# Patient Record
Sex: Female | Born: 1960 | Race: White | Hispanic: No | Marital: Married | Smoking: Never smoker
Health system: Southern US, Community
[De-identification: ages and names within clinical notes are randomized; demographics above are authoritative.]

## PROBLEM LIST (undated history)

## (undated) DIAGNOSIS — M1611 Unilateral primary osteoarthritis, right hip: Secondary | ICD-10-CM

## (undated) DIAGNOSIS — G709 Myoneural disorder, unspecified: Secondary | ICD-10-CM

## (undated) DIAGNOSIS — D649 Anemia, unspecified: Secondary | ICD-10-CM

## (undated) DIAGNOSIS — F988 Other specified behavioral and emotional disorders with onset usually occurring in childhood and adolescence: Secondary | ICD-10-CM

## (undated) DIAGNOSIS — C801 Malignant (primary) neoplasm, unspecified: Secondary | ICD-10-CM

## (undated) DIAGNOSIS — I959 Hypotension, unspecified: Secondary | ICD-10-CM

## (undated) DIAGNOSIS — Z9889 Other specified postprocedural states: Secondary | ICD-10-CM

## (undated) DIAGNOSIS — R112 Nausea with vomiting, unspecified: Secondary | ICD-10-CM

## (undated) DIAGNOSIS — M51369 Other intervertebral disc degeneration, lumbar region without mention of lumbar back pain or lower extremity pain: Secondary | ICD-10-CM

## (undated) DIAGNOSIS — E039 Hypothyroidism, unspecified: Secondary | ICD-10-CM

## (undated) DIAGNOSIS — E079 Disorder of thyroid, unspecified: Secondary | ICD-10-CM

## (undated) DIAGNOSIS — I82890 Acute embolism and thrombosis of other specified veins: Secondary | ICD-10-CM

## (undated) DIAGNOSIS — L570 Actinic keratosis: Secondary | ICD-10-CM

## (undated) HISTORY — PX: ARTHROSCOPIC REPAIR ACL: SUR80

## (undated) HISTORY — PX: EYE SURGERY: SHX253

## (undated) HISTORY — PX: OTHER SURGICAL HISTORY: SHX169

## (undated) HISTORY — PX: ABDOMINAL HYSTERECTOMY: SHX81

## (undated) HISTORY — PX: WISDOM TOOTH EXTRACTION: SHX21

---

## 2014-05-08 ENCOUNTER — Emergency Department (HOSPITAL_BASED_OUTPATIENT_CLINIC_OR_DEPARTMENT_OTHER): Payer: BC Managed Care – PPO

## 2014-05-08 ENCOUNTER — Encounter (HOSPITAL_BASED_OUTPATIENT_CLINIC_OR_DEPARTMENT_OTHER): Payer: Self-pay | Admitting: *Deleted

## 2014-05-08 ENCOUNTER — Emergency Department (HOSPITAL_BASED_OUTPATIENT_CLINIC_OR_DEPARTMENT_OTHER)
Admission: EM | Admit: 2014-05-08 | Discharge: 2014-05-08 | Disposition: A | Discharge status: Home / Self Care | Payer: BC Managed Care – PPO | Attending: Emergency Medicine | Admitting: Emergency Medicine

## 2014-05-08 DIAGNOSIS — W11XXXA Fall on and from ladder, initial encounter: Secondary | ICD-10-CM | POA: Diagnosis not present

## 2014-05-08 DIAGNOSIS — S92001A Unspecified fracture of right calcaneus, initial encounter for closed fracture: Secondary | ICD-10-CM | POA: Diagnosis not present

## 2014-05-08 DIAGNOSIS — Y998 Other external cause status: Secondary | ICD-10-CM | POA: Insufficient documentation

## 2014-05-08 DIAGNOSIS — Y9389 Activity, other specified: Secondary | ICD-10-CM | POA: Insufficient documentation

## 2014-05-08 DIAGNOSIS — Z79899 Other long term (current) drug therapy: Secondary | ICD-10-CM | POA: Diagnosis not present

## 2014-05-08 DIAGNOSIS — S43014A Anterior dislocation of right humerus, initial encounter: Secondary | ICD-10-CM | POA: Diagnosis not present

## 2014-05-08 DIAGNOSIS — E079 Disorder of thyroid, unspecified: Secondary | ICD-10-CM | POA: Diagnosis not present

## 2014-05-08 DIAGNOSIS — M24412 Recurrent dislocation, left shoulder: Secondary | ICD-10-CM

## 2014-05-08 DIAGNOSIS — S4991XA Unspecified injury of right shoulder and upper arm, initial encounter: Secondary | ICD-10-CM | POA: Diagnosis present

## 2014-05-08 DIAGNOSIS — W19XXXA Unspecified fall, initial encounter: Secondary | ICD-10-CM

## 2014-05-08 DIAGNOSIS — Y9289 Other specified places as the place of occurrence of the external cause: Secondary | ICD-10-CM | POA: Insufficient documentation

## 2014-05-08 HISTORY — DX: Disorder of thyroid, unspecified: E07.9

## 2014-05-08 MED ORDER — OXYCODONE-ACETAMINOPHEN 5-325 MG PO TABS: 1.0000 | ORAL_TABLET | Freq: Four times a day (QID) | ORAL | Status: DC | PRN

## 2014-05-08 MED ORDER — ONDANSETRON HCL 4 MG/2ML IJ SOLN
4.0000 mg | Freq: Once | INTRAMUSCULAR | Status: AC
  Administered 2014-05-08: 4 mg via INTRAVENOUS

## 2014-05-08 MED ORDER — MORPHINE SULFATE 4 MG/ML IJ SOLN
4.0000 mg | Freq: Once | INTRAMUSCULAR | Status: DC
  Filled 2014-05-08: qty 1

## 2014-05-08 MED ORDER — ONDANSETRON HCL 4 MG PO TABS: 4.0000 mg | ORAL_TABLET | Freq: Four times a day (QID) | ORAL | Status: DC

## 2014-05-08 MED ORDER — MORPHINE SULFATE 4 MG/ML IJ SOLN
4.0000 mg | Freq: Once | INTRAMUSCULAR | Status: AC
  Administered 2014-05-08: 4 mg via INTRAVENOUS
  Filled 2014-05-08: qty 1

## 2014-05-08 MED ORDER — MORPHINE SULFATE 4 MG/ML IJ SOLN
4.0000 mg | Freq: Once | INTRAMUSCULAR | Status: AC
  Administered 2014-05-08: 4 mg via INTRAVENOUS

## 2014-05-08 MED ORDER — ONDANSETRON HCL 4 MG/2ML IJ SOLN
4.0000 mg | Freq: Once | INTRAMUSCULAR | Status: DC
  Filled 2014-05-08: qty 2

## 2014-05-08 MED ORDER — FENTANYL CITRATE 0.05 MG/ML IJ SOLN
50.0000 ug | Freq: Once | INTRAMUSCULAR | Status: AC
  Administered 2014-05-08: 50 ug via INTRAVENOUS
  Filled 2014-05-08: qty 2

## 2014-05-08 NOTE — ED Notes (Addendum)
Pt c/o fall from ladder 6 ft landing on hardwood floor injuring left ankle and shoulder  X 1 hr ago denies head injury

## 2014-05-08 NOTE — Discharge Instructions (Signed)
Calcaneal Fracture Repair  There are many different ways of treating fractures of the large irregular bone in the foot that makes up the heel of the foot (calcaneus). Calcaneal fractures can be treated with:    Immobilization--The fracture is casted as it is without changing the positions of the fracture involved.    Closed reduction--The bones are manipulated back into position without opening the site of the fracture using surgery.    Open reduction and internal fixation--The fracture site is opened and the bone pieces are fixed into place with some type of hardware (such as a screw).    Primary arthrodesis--The joint has enough damage that a procedure is done as the first treatment which will leave the joint permanently stiff. This will decrease function, however usually will leave the joint pain free.  LET YOUR HEALTH CARE PROVIDER KNOW ABOUT:   Any allergies you have.    All medicines you are taking, including vitamins, herbs, eye drops, creams, and over-the-counter medicines.    Previous problems you or members of your family have had with the use of anesthetics.   Any blood disorders you have.    Previous surgeries you have had.    Medical conditions you have.   RISKS AND COMPLICATIONS  Generally, calcaneal fracture repair is a safe procedure. However, as with any procedure, complications can occur. Possible complications include:    Swelling of the foot and ankle.   Infection of the wound or bone.   Arthritis.   Chronic pain of the foot.   Nerve injury.   Blood clot in the legs or lungs.  BEFORE THE PROCEDURE   Ask your health care provider about changing or stopping your regular medicines. You may need to stop taking certain medicines, such as aspirin or blood thinners, at least 1 week before the surgery.   X-rays and any imaging studies are reviewed with your healthcare provider. The surgeon will advise you on the best surgical approach to repair your fracture.   Do not eat or  drink anything for at least 8 hours before the surgery or as directed by your health care provider.    If you smoke, do not smoke for at least 2 weeks before the surgery.    Make plans to have someone drive you home after the procedure. Also arrange for someone to help you with activities during recovery.   PROCEDURE    You will be given medicine to help you relax (sedative). You will then be given medicine to make you sleep through the procedure (general anesthetic). These medicines will be given through an IV access tube that is put into one of your veins.    A nerve block or numbing medicine (local anesthetic) may also be used to keep you comfortable.   Once you are asleep, the foot will be cleaned and shaved if needed.   The surgeon may use a percutaneous or open technique for this surgery:    In the percutaneous approach, small cuts and pins are used to repair the fracture.    In the open technique, a cut is made along the outside of the foot and the bone pieces are placed back together with hardware. A drain may be left to collect fluid. It is removed 3-4 days after the procedure.   The surgeon then uses staples or stitches to close the incision or cuts.  AFTER THE PROCEDURE   After surgery you will be taken to the recovery area where a nurse will   pain medicine if needed.  The IV access tube will be removed before you are discharged. Document Released: 02/18/2005 Document Revised: 03/01/2013 Document Reviewed: 12/13/2012 [facility] Patient Information 2015 [place], [place]. This information is not intended to replace advice given to you by your health care provider. Make sure you discuss any questions you have with your health care provider.   Shoulder Dislocation Your shoulder is made up of three  bones: the collar bone (clavicle); the shoulder blade (scapula), which includes the socket (glenoid cavity); and the upper arm bone (humerus). Your shoulder joint is the place where these bones meet. Strong, fibrous tissues hold these bones together (ligaments). Muscles and strong, fibrous tissues that connect the muscles to these bones (tendons) allow your arm to move through this joint. The range of motion of your shoulder joint is more extensive than most of your other joints, and the glenoid cavity is very shallow. That is the reason that your shoulder joint is one of the most unstable joints in your body. It is far more prone to dislocation than your other joints. Shoulder dislocation is when your humerus is forced out of your shoulder joint. CAUSES Shoulder dislocation is caused by a forceful impact on your shoulder. This impact usually is from an injury, such as a sports injury or a fall. SYMPTOMS Symptoms of shoulder dislocation include:  Deformity of your shoulder.  Intense pain.  Inability to move your shoulder joint.  Numbness, weakness, or tingling around your shoulder joint (your neck or down your arm).  Bruising or swelling around your shoulder. DIAGNOSIS In order to diagnose a dislocated shoulder, your caregiver will perform a physical exam. Your caregiver also may have an X-ray exam done to see if you have any broken bones. Magnetic resonance imaging (MRI) is a procedure that sometimes is done to help your caregiver see any damage to the soft tissues around your shoulder, particularly your rotator cuff tendons. Additionally, your caregiver also may have electromyography done to measure the electrical discharges produced in your muscles if you have signs or symptoms of nerve damage. TREATMENT A shoulder dislocation is treated by placing the humerus back in the joint (reduction). Your caregiver does this either manually (closed reduction), by moving your humerus back into the joint  through manipulation, or through surgery (open reduction). When your humerus is back in place, severe pain should improve almost immediately. You also may need to have surgery if you have a weak shoulder joint or ligaments, and you have recurring shoulder dislocations, despite rehabilitation. In rare cases, surgery is necessary if your nerves or blood vessels are damaged during the dislocation. After your reduction, your arm will be placed in a shoulder immobilizer or sling to keep it from moving. Your caregiver will have you wear your shoulder immobilizer or sling for 3 days to 3 weeks, depending on how serious your dislocation is. When your shoulder immobilizer or sling is removed, your caregiver may prescribe physical therapy to help improve the range of motion in your shoulder joint. HOME CARE INSTRUCTIONS  The following measures can help to reduce pain and speed up the healing process:  Rest your injured joint. Do not move it. Avoid activities similar to the one that caused your injury.  Apply ice to your injured joint for the first day or two after your reduction or as directed by your caregiver. Applying ice helps to reduce inflammation and pain.  Put ice in a plastic bag.  Place a towel between your skin and the  bag.  Leave the ice on for 15-20 minutes at a time, every 2 hours while you are awake.  Exercise your hand by squeezing a soft ball. This helps to eliminate stiffness and swelling in your hand and wrist.  Take over-the-counter or prescription medicine for pain or discomfort as told by your caregiver. SEEK IMMEDIATE MEDICAL CARE IF:   Your shoulder immobilizer or sling becomes damaged.  Your pain becomes worse rather than better.  You lose feeling in your arm or hand, or they become white and cold. MAKE SURE YOU:   Understand these instructions.  Will watch your condition.  Will get help right away if you are not doing well or get worse. Document Released: 02/03/2001  Document Revised: 09/25/2013 Document Reviewed: 03/01/2011 [facility] Patient Information 2015 [place], [place]. This information is not intended to replace advice given to you by your health care provider. Make sure you discuss any questions you have with your health care provider.

## 2014-05-08 NOTE — ED Provider Notes (Signed)
CSN: [id number]     Arrival date & time 05/08/14  1322 History   First MD Initiated Contact with Patient 05/08/14 1457     Chief Complaint  Patient presents with  . Fall     (Consider location/radiation/quality/duration/timing/severity/associated sxs/prior Treatment) HPI Comments: Patient fell approximately 6 feet off a ladder onto her right heel and onto her right shoulder.  She had her chin on the ladder rung on the way down.  She did not hit her head.  She denies headache, neck pain, shortness of breath, numbness or weakness.  She has no back pain.  Patient is a 53 y.o. female presenting with fall. The history is provided by the patient. No language interpreter was used.  Fall This is a new problem. The current episode started 3 to 5 hours ago. The problem occurs rarely. The problem has not changed since onset.Pertinent negatives include no chest pain, no abdominal pain, no headaches and no shortness of breath. Associated symptoms comments: R heel and R shoulder pain . Exacerbated by: Palpation and movement. Relieved by: Immobilization. She has tried rest for the symptoms. The treatment provided mild relief.    Past Medical History  Diagnosis Date  . Thyroid disease    Past Surgical History  Procedure Laterality Date  . Abdominal hysterectomy    . Arthroscopic repair acl     History reviewed. No pertinent family history. History  Substance Use Topics  . Smoking status: Never Smoker   . Smokeless tobacco: Not on file  . Alcohol Use: No   OB History    No data available     Review of Systems  Constitutional: Negative for fever, chills, diaphoresis, activity change, appetite change and fatigue.  HENT: Negative for congestion, facial swelling, rhinorrhea and sore throat.   Eyes: Negative for photophobia and discharge.  Respiratory: Negative for cough, chest tightness and shortness of breath.   Cardiovascular: Negative for chest pain, palpitations and leg swelling.    Gastrointestinal: Negative for nausea, vomiting, abdominal pain and diarrhea.  Endocrine: Negative for polydipsia and polyuria.  Genitourinary: Negative for dysuria, frequency, difficulty urinating and pelvic pain.  Musculoskeletal: Negative for back pain, arthralgias, neck pain and neck stiffness.  Skin: Negative for color change and wound.  Allergic/Immunologic: Negative for immunocompromised state.  Neurological: Negative for facial asymmetry, weakness, numbness and headaches.  Hematological: Does not bruise/bleed easily.  Psychiatric/Behavioral: Negative for confusion and agitation.      Allergies  Ciprofloxacin  Home Medications   Prior to Admission medications   Medication Sig Start Date End Date Taking? Authorizing Provider  levothyroxine (SYNTHROID, LEVOTHROID) 100 MCG tablet Take 100 mcg by mouth daily before breakfast.   Yes Historical Provider, MD  testosterone (ANDROGEL) 50 MG/5GM (1%) GEL Place 5 g onto the skin daily.   Yes Historical Provider, MD  ondansetron (ZOFRAN) 4 MG tablet Take 1 tablet (4 mg total) by mouth every 6 (six) hours. 05/08/14   Toy Cookey, MD  oxyCODONE-acetaminophen (PERCOCET) 5-325 MG per tablet Take 1-2 tablets by mouth every 6 (six) hours as needed. 05/08/14   Toy Cookey, MD   BP 101/48 mmHg  Pulse 64  Temp(Src) 98.9 F (37.2 C)  Resp 16  Ht 5\' 6"  (1.676 m)  Wt 140 lb (63.504 kg)  BMI 22.61 kg/m2  SpO2 100% Physical Exam  Constitutional: She is oriented to person, place, and time. She appears well-developed and well-nourished. No distress.  HENT:  Head: Normocephalic and atraumatic.  Mouth/Throat: No oropharyngeal exudate.  Eyes:  Pupils are equal, round, and reactive to light.  Neck: Normal range of motion. Neck supple.  Cardiovascular: Normal rate, regular rhythm and normal heart sounds.  Exam reveals no gallop and no friction rub.   No murmur heard. Pulmonary/Chest: Effort normal and breath sounds normal. No respiratory  distress. She has no wheezes. She has no rales.  Abdominal: Soft. Bowel sounds are normal. She exhibits no distension and no mass. There is no tenderness. There is no rebound and no guarding.  Musculoskeletal: Normal range of motion. She exhibits no edema or tenderness.       Right shoulder: She exhibits deformity (deformity consistent with anterior shoulder dislocation).       Feet:  Neurological: She is alert and oriented to person, place, and time.  Skin: Skin is warm and dry.  Psychiatric: She has a normal mood and affect.    ED Course  Reduction of dislocation Date/Time: 05/08/2014 4:08 PM Performed by: Toy CookeyHERTY, MEGAN Authorized by: Toy CookeyHERTY, MEGAN Consent: Verbal consent obtained. Written consent obtained. Risks and benefits: risks, benefits and alternatives were discussed Consent given by: patient Patient understanding: patient states understanding of the procedure being performed Patient consent: the patient's understanding of the procedure matches consent given Procedure consent: procedure consent matches procedure scheduled Relevant documents: relevant documents present and verified Test results: test results available and properly labeled Site marked: the operative site was marked Imaging studies: imaging studies available Required items: required blood products, implants, devices, and special equipment available Patient identity confirmed: verbally with patient Time out: Immediately prior to procedure a "time out" was called to verify the correct patient, procedure, equipment, support staff and site/side marked as required. Local anesthesia used: no Patient sedated: Morphine given for pain. Patient tolerance: Patient tolerated the procedure well with no immediate complications Comments: Shoulder reduced using massage and gentle manipulation   (including critical care time) Labs Review Labs Reviewed - No data to display  Imaging Review Dg Shoulder  Right  05/08/2014   CLINICAL DATA:  Status post reduction of anterior right shoulder dislocation.  EXAM: RIGHT SHOULDER - 2+ VIEW  COMPARISON:  Plain films right shoulder 05/08/2014 at 14:10 hours  FINDINGS: The humeral head appears reduced.  No new abnormality.  IMPRESSION: Successful reduction of shoulder dislocation.   Electronically Signed   By: Drusilla Kannerhomas  Dalessio M.D.   On: 05/08/2014 15:59   Dg Shoulder Right  05/08/2014   CLINICAL DATA:  Right shoulder pain, fell off ladder this morning  EXAM: RIGHT SHOULDER - 2+ VIEW  COMPARISON:  None.  FINDINGS: Two views of the right shoulder submitted. There is anterior subluxation of humeral head from glenohumeral joint. No acute fracture.  IMPRESSION: Anterior shoulder dislocation.   Electronically Signed   By: Natasha MeadLiviu  Pop M.D.   On: 05/08/2014 15:05   Dg Ankle Complete Right  05/08/2014   CLINICAL DATA:  Larey SeatFell off ladder this morning, right shoulder pain, right ankle pain  EXAM: RIGHT ANKLE - COMPLETE 3+ VIEW  COMPARISON:  None.  FINDINGS: Three views of the right ankle submitted. No ankle fracture or subluxation. Ankle mortise is preserved. There is minimal impacted probable comminuted fracture of the right calcaneus.  IMPRESSION: No ankle fracture. Minimal impacted probable comminuted fracture of the right calcaneus.   Electronically Signed   By: Natasha MeadLiviu  Pop M.D.   On: 05/08/2014 14:27     EKG Interpretation None      MDM   Final diagnoses:  Fall from ladder, initial encounter  Right calcaneal fracture,  closed, initial encounter  Anterior shoulder dislocation, right, initial encounter    Pt is a 53 y.o. female with Pmhx as above who presents with right heel and right shoulder pain after falling off a ladder from approximately 6 feet onto a hard wood floor at approximately 12:30 PM.  She states that she hit her chin on the way down, but did not hit her head.  She had no loss of consciousness.  She has no headache or neck pain, no back pain, no  abdominal or chest pain.  On physical exam vital signs are stable.  Patient is uncomfortable but in no acute distress.  She has deformity of right shoulder consistent with anterior dislocation.  She also has ecchymosis about the left ankle with bony tenderness over the calcaneus.  X-ray of shoulder confirms anterior dislocation without fracture, which was reduced at bedside using massage and gentle manupulation after IV morphine. Repeat XR confims relocation.    An x-ray of the ankle shows no ankle fracture per but minimally impacted probable comminuted fracture of her right calcaneus. NVI distally, no reproducible back pain.  I will speak with orthopedics regarding close outpatient follow-up.    I spoke w/ Dr. Despina HickAlusio, who will be out of town for a few weeks starting tomorrow and would lke her to follow up in office w/ Dr. Victorino DikeHewitt.   Misty StanleyStacey Moll evaluation in the Emergency Department is complete. It has been determined that no acute conditions requiring further emergency intervention are present at this time. The patient/guardian have been advised of the diagnosis and plan. We have discussed signs and symptoms that warrant return to the ED, such as changes or worsening in symptoms, worsening or uncontrolled pain, numbness, weakness.      Toy CookeyMegan Docherty, MD 05/08/14 [phone]

## 2014-05-09 NOTE — ED Notes (Signed)
Received phone call from husband; unable to get appt with [facility] as follow MD is out of the country and other MD in practice is not available.   [facility], Dr. Lajoyce Cornersuda [phone].

## 2014-05-10 ENCOUNTER — Other Ambulatory Visit: Payer: Self-pay | Admitting: Orthopedic Surgery

## 2014-05-10 ENCOUNTER — Ambulatory Visit
Admission: RE | Admit: 2014-05-10 | Discharge: 2014-05-10 | Disposition: A | Discharge status: Home / Self Care | Payer: BC Managed Care – PPO | Source: Ambulatory Visit | Attending: Orthopedic Surgery | Admitting: Orthopedic Surgery

## 2014-05-10 DIAGNOSIS — Z01818 Encounter for other preprocedural examination: Secondary | ICD-10-CM

## 2014-05-10 DIAGNOSIS — R52 Pain, unspecified: Secondary | ICD-10-CM

## 2014-05-11 ENCOUNTER — Ambulatory Visit (HOSPITAL_COMMUNITY): Payer: BC Managed Care – PPO | Admitting: Anesthesiology

## 2014-05-11 ENCOUNTER — Encounter (HOSPITAL_COMMUNITY)
Admission: AD | Disposition: A | Discharge status: Home / Self Care | Payer: Self-pay | Source: Ambulatory Visit | Attending: Orthopedic Surgery

## 2014-05-11 ENCOUNTER — Encounter (HOSPITAL_COMMUNITY): Payer: Self-pay | Admitting: *Deleted

## 2014-05-11 ENCOUNTER — Other Ambulatory Visit (HOSPITAL_COMMUNITY): Payer: Self-pay | Admitting: Orthopedic Surgery

## 2014-05-11 ENCOUNTER — Ambulatory Visit (HOSPITAL_COMMUNITY)
Admission: AD | Admit: 2014-05-11 | Discharge: 2014-05-13 | Disposition: A | Discharge status: Home / Self Care | Payer: BC Managed Care – PPO | Source: Ambulatory Visit | Attending: Orthopedic Surgery | Admitting: Orthopedic Surgery

## 2014-05-11 DIAGNOSIS — S92009A Unspecified fracture of unspecified calcaneus, initial encounter for closed fracture: Secondary | ICD-10-CM | POA: Diagnosis present

## 2014-05-11 DIAGNOSIS — Z881 Allergy status to other antibiotic agents status: Secondary | ICD-10-CM | POA: Diagnosis not present

## 2014-05-11 DIAGNOSIS — W11XXXA Fall on and from ladder, initial encounter: Secondary | ICD-10-CM | POA: Diagnosis not present

## 2014-05-11 DIAGNOSIS — E079 Disorder of thyroid, unspecified: Secondary | ICD-10-CM | POA: Insufficient documentation

## 2014-05-11 DIAGNOSIS — S92001A Unspecified fracture of right calcaneus, initial encounter for closed fracture: Secondary | ICD-10-CM

## 2014-05-11 DIAGNOSIS — Y9289 Other specified places as the place of occurrence of the external cause: Secondary | ICD-10-CM | POA: Diagnosis not present

## 2014-05-11 DIAGNOSIS — Y998 Other external cause status: Secondary | ICD-10-CM | POA: Diagnosis not present

## 2014-05-11 DIAGNOSIS — Y9389 Activity, other specified: Secondary | ICD-10-CM | POA: Insufficient documentation

## 2014-05-11 HISTORY — PX: ORIF CALCANEOUS FRACTURE: SHX5030

## 2014-05-11 HISTORY — DX: Hypotension, unspecified: I95.9

## 2014-05-11 LAB — CBC
HCT: 37.1 % (ref 36.0–46.0)
Hemoglobin: 12.2 g/dL (ref 12.0–15.0)
MCH: 29.1 pg (ref 26.0–34.0)
MCHC: 32.9 g/dL (ref 30.0–36.0)
MCV: 88.5 fL (ref 78.0–100.0)
PLATELETS: 223 10*3/uL (ref 150–400)
RBC: 4.19 MIL/uL (ref 3.87–5.11)
RDW: 12.9 % (ref 11.5–15.5)
WBC: 5.2 10*3/uL (ref 4.0–10.5)

## 2014-05-11 SURGERY — OPEN REDUCTION INTERNAL FIXATION (ORIF) CALCANEOUS FRACTURE
Anesthesia: General | Site: Foot | Laterality: Right

## 2014-05-11 MED ORDER — LIDOCAINE HCL (CARDIAC) 20 MG/ML IV SOLN
INTRAVENOUS | Status: DC | PRN
  Administered 2014-05-11: 50 mg via INTRAVENOUS

## 2014-05-11 MED ORDER — ROCURONIUM BROMIDE 100 MG/10ML IV SOLN
INTRAVENOUS | Status: DC | PRN
  Administered 2014-05-11: 30 mg via INTRAVENOUS

## 2014-05-11 MED ORDER — LACTATED RINGERS IV SOLN
INTRAVENOUS | Status: DC | PRN
  Administered 2014-05-11: 15:00:00 via INTRAVENOUS

## 2014-05-11 MED ORDER — FENTANYL CITRATE 0.05 MG/ML IJ SOLN
INTRAMUSCULAR | Status: AC
  Filled 2014-05-11: qty 5

## 2014-05-11 MED ORDER — MAGNESIUM CITRATE PO SOLN: 1.0000 | Freq: Once | ORAL | Status: AC | PRN

## 2014-05-11 MED ORDER — SODIUM CHLORIDE 0.9 % IV SOLN
INTRAVENOUS | Status: DC
  Administered 2014-05-11: 22:00:00 via INTRAVENOUS

## 2014-05-11 MED ORDER — ARTIFICIAL TEARS OP OINT
TOPICAL_OINTMENT | OPHTHALMIC | Status: AC
  Filled 2014-05-11: qty 3.5

## 2014-05-11 MED ORDER — LEVOTHYROXINE SODIUM 100 MCG PO TABS
100.0000 ug | ORAL_TABLET | Freq: Every day | ORAL | Status: DC
  Administered 2014-05-12: 100 ug via ORAL
  Filled 2014-05-11 (×4): qty 1

## 2014-05-11 MED ORDER — DEXAMETHASONE SODIUM PHOSPHATE 4 MG/ML IJ SOLN
INTRAMUSCULAR | Status: DC | PRN
  Administered 2014-05-11: 4 mg via INTRAVENOUS

## 2014-05-11 MED ORDER — FENTANYL CITRATE 0.05 MG/ML IJ SOLN
INTRAMUSCULAR | Status: AC
  Filled 2014-05-11: qty 2

## 2014-05-11 MED ORDER — CEFAZOLIN SODIUM-DEXTROSE 2-3 GM-% IV SOLR
INTRAVENOUS | Status: AC
  Administered 2014-05-11: 2 g via INTRAVENOUS
  Filled 2014-05-11: qty 50

## 2014-05-11 MED ORDER — ROPIVACAINE HCL 5 MG/ML IJ SOLN
INTRAMUSCULAR | Status: DC | PRN
  Administered 2014-05-11: 20 mL via PERINEURAL

## 2014-05-11 MED ORDER — MAGNESIUM HYDROXIDE 400 MG/5ML PO SUSP: 30.0000 mL | Freq: Every day | ORAL | Status: DC | PRN

## 2014-05-11 MED ORDER — 0.9 % SODIUM CHLORIDE (POUR BTL) OPTIME
TOPICAL | Status: DC | PRN
  Administered 2014-05-11: 1000 mL

## 2014-05-11 MED ORDER — GLYCOPYRROLATE 0.2 MG/ML IJ SOLN
INTRAMUSCULAR | Status: AC
  Filled 2014-05-11: qty 2

## 2014-05-11 MED ORDER — ROCURONIUM BROMIDE 50 MG/5ML IV SOLN
INTRAVENOUS | Status: AC
  Filled 2014-05-11: qty 1

## 2014-05-11 MED ORDER — OXYCODONE-ACETAMINOPHEN 5-325 MG PO TABS
1.0000 | ORAL_TABLET | ORAL | Status: DC | PRN
  Administered 2014-05-12 (×2): 2 via ORAL
  Administered 2014-05-12: 1 via ORAL
  Administered 2014-05-12 (×2): 2 via ORAL
  Administered 2014-05-12: 1 via ORAL
  Administered 2014-05-13 (×2): 2 via ORAL
  Filled 2014-05-11 (×3): qty 2
  Filled 2014-05-11: qty 1
  Filled 2014-05-11: qty 2
  Filled 2014-05-11: qty 1
  Filled 2014-05-11 (×2): qty 2

## 2014-05-11 MED ORDER — DOCUSATE SODIUM 100 MG PO CAPS
100.0000 mg | ORAL_CAPSULE | Freq: Two times a day (BID) | ORAL | Status: DC
  Administered 2014-05-11 – 2014-05-13 (×4): 100 mg via ORAL
  Filled 2014-05-11 (×5): qty 1

## 2014-05-11 MED ORDER — MIDAZOLAM HCL 2 MG/2ML IJ SOLN
INTRAMUSCULAR | Status: AC
  Filled 2014-05-11: qty 2

## 2014-05-11 MED ORDER — ONDANSETRON HCL 4 MG PO TABS
4.0000 mg | ORAL_TABLET | Freq: Four times a day (QID) | ORAL | Status: DC
  Administered 2014-05-12 – 2014-05-13 (×5): 4 mg via ORAL
  Filled 2014-05-11 (×12): qty 1

## 2014-05-11 MED ORDER — CEFAZOLIN SODIUM-DEXTROSE 2-3 GM-% IV SOLR
2.0000 g | INTRAVENOUS | Status: DC
Start: 2014-05-12 — End: 2014-05-11

## 2014-05-11 MED ORDER — HYDROMORPHONE HCL 1 MG/ML IJ SOLN
0.5000 mg | INTRAMUSCULAR | Status: DC | PRN
  Administered 2014-05-12 (×4): 1 mg via INTRAVENOUS
  Filled 2014-05-11 (×4): qty 1

## 2014-05-11 MED ORDER — METHYLPHENIDATE HCL ER 20 MG PO TBCR: 20.0000 mg | EXTENDED_RELEASE_TABLET | Freq: Two times a day (BID) | ORAL | Status: DC

## 2014-05-11 MED ORDER — METOCLOPRAMIDE HCL 5 MG PO TABS
5.0000 mg | ORAL_TABLET | Freq: Three times a day (TID) | ORAL | Status: DC | PRN
  Filled 2014-05-11: qty 2

## 2014-05-11 MED ORDER — PROPOFOL 10 MG/ML IV BOLUS
INTRAVENOUS | Status: AC
  Filled 2014-05-11: qty 20

## 2014-05-11 MED ORDER — METHOCARBAMOL 1000 MG/10ML IJ SOLN
500.0000 mg | Freq: Four times a day (QID) | INTRAVENOUS | Status: DC | PRN
  Filled 2014-05-11: qty 5

## 2014-05-11 MED ORDER — ASPIRIN EC 325 MG PO TBEC: 325.0000 mg | DELAYED_RELEASE_TABLET | Freq: Every day | ORAL | Status: DC

## 2014-05-11 MED ORDER — OXYCODONE-ACETAMINOPHEN 5-325 MG PO TABS: 1.0000 | ORAL_TABLET | ORAL | Status: DC | PRN

## 2014-05-11 MED ORDER — FENTANYL CITRATE 0.05 MG/ML IJ SOLN
INTRAMUSCULAR | Status: DC | PRN
  Administered 2014-05-11: 100 ug via INTRAVENOUS

## 2014-05-11 MED ORDER — BISACODYL 5 MG PO TBEC: 5.0000 mg | DELAYED_RELEASE_TABLET | Freq: Every day | ORAL | Status: DC | PRN

## 2014-05-11 MED ORDER — PROPOFOL 10 MG/ML IV BOLUS
INTRAVENOUS | Status: DC | PRN
  Administered 2014-05-11: 150 mg via INTRAVENOUS

## 2014-05-11 MED ORDER — METHOCARBAMOL 500 MG PO TABS
500.0000 mg | ORAL_TABLET | Freq: Four times a day (QID) | ORAL | Status: DC | PRN
  Administered 2014-05-12 – 2014-05-13 (×5): 500 mg via ORAL
  Filled 2014-05-11 (×5): qty 1

## 2014-05-11 MED ORDER — TESTOSTERONE 50 MG/5GM (1%) TD GEL: 5.0000 g | Freq: Every day | TRANSDERMAL | Status: DC

## 2014-05-11 MED ORDER — ONDANSETRON HCL 4 MG PO TABS
4.0000 mg | ORAL_TABLET | Freq: Four times a day (QID) | ORAL | Status: DC | PRN
  Administered 2014-05-12: 4 mg via ORAL

## 2014-05-11 MED ORDER — ONDANSETRON HCL 4 MG/2ML IJ SOLN: 4.0000 mg | Freq: Four times a day (QID) | INTRAMUSCULAR | Status: DC | PRN

## 2014-05-11 MED ORDER — METOCLOPRAMIDE HCL 5 MG/ML IJ SOLN
5.0000 mg | Freq: Three times a day (TID) | INTRAMUSCULAR | Status: DC | PRN
  Administered 2014-05-12: 5 mg via INTRAVENOUS
  Filled 2014-05-11: qty 2

## 2014-05-11 MED ORDER — ONDANSETRON HCL 4 MG/2ML IJ SOLN
INTRAMUSCULAR | Status: DC | PRN
  Administered 2014-05-11: 4 mg via INTRAVENOUS

## 2014-05-11 MED ORDER — MIDAZOLAM HCL 5 MG/5ML IJ SOLN
INTRAMUSCULAR | Status: DC | PRN
  Administered 2014-05-11: 2 mg via INTRAVENOUS

## 2014-05-11 MED ORDER — ONDANSETRON HCL 4 MG/2ML IJ SOLN
INTRAMUSCULAR | Status: AC
  Filled 2014-05-11: qty 2

## 2014-05-11 MED ORDER — CEFAZOLIN SODIUM 1-5 GM-% IV SOLN
1.0000 g | Freq: Four times a day (QID) | INTRAVENOUS | Status: AC
  Administered 2014-05-11 – 2014-05-12 (×3): 1 g via INTRAVENOUS
  Filled 2014-05-11 (×3): qty 50

## 2014-05-11 MED ORDER — LACTATED RINGERS IV SOLN: INTRAVENOUS | Status: DC

## 2014-05-11 MED ORDER — ASPIRIN EC 325 MG PO TBEC
325.0000 mg | DELAYED_RELEASE_TABLET | Freq: Every day | ORAL | Status: DC
  Administered 2014-05-11 – 2014-05-13 (×3): 325 mg via ORAL
  Filled 2014-05-11 (×3): qty 1

## 2014-05-11 SURGICAL SUPPLY — 55 items
BANDAGE ELASTIC 4 VELCRO ST LF (GAUZE/BANDAGES/DRESSINGS) IMPLANT
BANDAGE ELASTIC 6 VELCRO ST LF (GAUZE/BANDAGES/DRESSINGS) IMPLANT
BANDAGE ESMARK 6X9 LF (GAUZE/BANDAGES/DRESSINGS) IMPLANT
BIT DRILL LCP QC 2X140 (BIT) ×2 IMPLANT
BNDG COHESIVE 6X5 TAN STRL LF (GAUZE/BANDAGES/DRESSINGS) ×2 IMPLANT
BNDG ESMARK 6X9 LF (GAUZE/BANDAGES/DRESSINGS)
BNDG GAUZE ELAST 4 BULKY (GAUZE/BANDAGES/DRESSINGS) ×2 IMPLANT
BNDG GAUZE STRTCH 6 (GAUZE/BANDAGES/DRESSINGS) ×6 IMPLANT
COTTON STERILE ROLL (GAUZE/BANDAGES/DRESSINGS) ×2 IMPLANT
COVER SURGICAL LIGHT HANDLE (MISCELLANEOUS) ×4 IMPLANT
CUFF TOURNIQUET SINGLE 34IN LL (TOURNIQUET CUFF) IMPLANT
CUFF TOURNIQUET SINGLE 44IN (TOURNIQUET CUFF) IMPLANT
DRAPE INCISE IOBAN 66X45 STRL (DRAPES) IMPLANT
DRAPE OEC MINIVIEW 54X84 (DRAPES) ×2 IMPLANT
DRAPE PROXIMA HALF (DRAPES) ×2 IMPLANT
DRAPE U-SHAPE 47X51 STRL (DRAPES) ×2 IMPLANT
DRSG ADAPTIC 3X8 NADH LF (GAUZE/BANDAGES/DRESSINGS) ×2 IMPLANT
DRSG PAD ABDOMINAL 8X10 ST (GAUZE/BANDAGES/DRESSINGS) ×2 IMPLANT
DURAPREP 26ML APPLICATOR (WOUND CARE) ×2 IMPLANT
ELECT REM PT RETURN 9FT ADLT (ELECTROSURGICAL) ×2
ELECTRODE REM PT RTRN 9FT ADLT (ELECTROSURGICAL) ×1 IMPLANT
GAUZE SPONGE 4X4 12PLY STRL (GAUZE/BANDAGES/DRESSINGS) IMPLANT
GLOVE BIOGEL PI IND STRL 9 (GLOVE) ×1 IMPLANT
GLOVE BIOGEL PI INDICATOR 9 (GLOVE) ×1
GLOVE SURG ORTHO 9.0 STRL STRW (GLOVE) ×6 IMPLANT
GOWN STRL REUS W/ TWL LRG LVL3 (GOWN DISPOSABLE) ×2 IMPLANT
GOWN STRL REUS W/ TWL XL LVL3 (GOWN DISPOSABLE) ×1 IMPLANT
GOWN STRL REUS W/TWL LRG LVL3 (GOWN DISPOSABLE) ×2
GOWN STRL REUS W/TWL XL LVL3 (GOWN DISPOSABLE) ×1
JOYSTICK REDUCTION 5.0 (MISCELLANEOUS) ×2 IMPLANT
KIT BASIN OR (CUSTOM PROCEDURE TRAY) ×2 IMPLANT
KIT ROOM TURNOVER OR (KITS) ×2 IMPLANT
NS IRRIG 1000ML POUR BTL (IV SOLUTION) ×2 IMPLANT
PACK ORTHO EXTREMITY (CUSTOM PROCEDURE TRAY) ×2 IMPLANT
PAD ARMBOARD 7.5X6 YLW CONV (MISCELLANEOUS) ×4 IMPLANT
PADDING CAST COTTON 6X4 STRL (CAST SUPPLIES) ×2 IMPLANT
PLATE CALCNEAL (Plate) ×2 IMPLANT
SCREW LOCKING VA 2.7X30MM (Screw) ×2 IMPLANT
SCREW LOCKING VA 2.7X40MM (Screw) ×2 IMPLANT
SCREW METAPHYSEAL 2.7X28 (Screw) ×4 IMPLANT
SCREW METAPHYSEAL 2.7X30 (Screw) ×4 IMPLANT
SCREW METAPHYSEAL 2.7X32MM (Screw) ×2 IMPLANT
SCREW VA LOCKING 2.7X36 (Screw) ×1 IMPLANT
SCREW VA LOCKING 2.7X36 VA (Screw) ×1 IMPLANT
SPONGE GAUZE 4X4 12PLY STER LF (GAUZE/BANDAGES/DRESSINGS) ×2 IMPLANT
SPONGE LAP 18X18 X RAY DECT (DISPOSABLE) ×2 IMPLANT
STAPLER VISISTAT 35W (STAPLE) IMPLANT
SUCTION FRAZIER TIP 10 FR DISP (SUCTIONS) ×2 IMPLANT
SUT ETHILON 2 0 PSLX (SUTURE) ×2 IMPLANT
SUT VIC AB 2-0 CTB1 (SUTURE) ×4 IMPLANT
TOWEL OR 17X24 6PK STRL BLUE (TOWEL DISPOSABLE) ×2 IMPLANT
TOWEL OR 17X26 10 PK STRL BLUE (TOWEL DISPOSABLE) ×2 IMPLANT
TUBE CONNECTING 12X1/4 (SUCTIONS) ×2 IMPLANT
WATER STERILE IRR 1000ML POUR (IV SOLUTION) ×2 IMPLANT
YANKAUER SUCT BULB TIP NO VENT (SUCTIONS) ×2 IMPLANT

## 2014-05-11 NOTE — Anesthesia Procedure Notes (Signed)
Anesthesia Regional Block:  Popliteal block  Pre-Anesthetic Checklist: ,, timeout performed, Correct Patient, Correct Site, Correct Laterality, Correct Procedure, Correct Position, site marked, Risks and benefits discussed,  Surgical consent,  Pre-op evaluation,  At surgeon's request and post-op pain management  Laterality: Right  Prep: chloraprep       Needles:  Injection technique: Single-shot  Needle Type: Stimiplex     Needle Length: 10cm 10 cm Needle Gauge: 21 and 21 G    Additional Needles:  Procedures: ultrasound guided (picture in chart) and nerve stimulator  Motor weakness within 5 minutes. Popliteal block  Nerve Stimulator or Paresthesia:  Response: Eversion, 0.5 mA,   Additional Responses:   Narrative:  Injection made incrementally with aspirations every 5 mL.  Performed by: Personally  Anesthesiologist: Gaylan GeroldGERMEROTH, Joliana Claflin R

## 2014-05-11 NOTE — Progress Notes (Signed)
Orthopedic Tech Progress Note Patient Details:  Carol CondonStacey Jacobs 1961-05-22 [id number]  Ortho Devices Type of Ortho Device: Postop shoe/boot Ortho Device/Splint Location: RLE  Ortho Device/Splint Interventions: Ordered, Application   Carol Jacobs, Carol Jacobs 05/11/2014, 9:14 PM

## 2014-05-11 NOTE — H&P (Signed)
Carol Jacobs is an 53 y.o. female.   Chief Complaint: Displaced right posterior facet calcaneal fracture. HPI: Patient is a 53 year old woman who fell from a ladder sustaining a calcaneal fracture. Follow-up CT scan shows displacement of the posterior facet and she presents at this time for open reduction internal fixation.  Past Medical History  Diagnosis Date  . Thyroid disease     Past Surgical History  Procedure Laterality Date  . Abdominal hysterectomy    . Arthroscopic repair acl      No family history on file. Social History:  reports that she has never smoked. She does not have any smokeless tobacco history on file. She reports that she does not drink alcohol or use illicit drugs.  Allergies:  Allergies  Allergen Reactions  . Ciprofloxacin     No prescriptions prior to admission    No results found for this or any previous visit (from the past 48 hour(s)). Ct Ankle Right Wo Contrast  05/10/2014   CLINICAL DATA:  Status post fall from a ladder 05/08/2014 with a calcaneal fracture.  EXAM: CT OF THE RIGHT ANKLE WITHOUT CONTRAST  TECHNIQUE: Multidetector CT imaging of the right ankle was performed according to the standard protocol. Multiplanar CT image reconstructions were also generated.  COMPARISON:  Plain films of the right ankle 05/08/2014.  FINDINGS: As seen on plain films, the patient has a comminuted fracture of the calcaneus. The main component of the fracture is oblique in orientation extending from the posterior and medial calcaneus in an anterior and lateral orientation through the posterior facet of the subtalar joint and the body of the calcaneus laterally. At the lateral aspect of the posterior facet, the fracture is distracted 0.7 cm from left right for a length of 1.2 cm AP. There is a nondisplaced component of the fracture through the calcaneus at the middle facet of the subtalar joint. The fracture does not involve the calcaneocuboid joint. No other fracture is  identified.  Extensive soft tissue swelling and hematoma are seen about the ankle. No tendon entrapment is identified.  IMPRESSION: Comminuted fracture of the calcaneus most consistent with a Sanders type 2A injury. As described above there is a markedly distracted component through lateral aspect of the posterior facet of the subtalar joint and a nondisplaced component through the middle facet of the subtalar joint.   Electronically Signed   By: Drusilla Kannerhomas  Dalessio M.D.   On: 05/10/2014 11:07    Review of Systems  All other systems reviewed and are negative.   There were no vitals taken for this visit. Physical Exam  Semination patient has palpable pulses she has no fracture blisters very minimal swelling. CT scan shows displaced posterior facet fracture of the calcaneus.  Assessment/Plan Assessment: Right calcaneal fracture with displacement of the posterior facet.  Plan: We'll plan for open reduction internal fixation. Risks and benefits were discussed including infection neurovascular injury persistent pain arthritis need for additional surgery. Patient states she understands and wishes to proceed at this time.  DUDA,MARCUS V 05/11/2014, 12:25 PM

## 2014-05-11 NOTE — Anesthesia Postprocedure Evaluation (Signed)
Anesthesia Post Note  Patient: Carol Jacobs  Procedure(s) Performed: Procedure(s) (LRB): OPEN REDUCTION INTERNAL FIXATION (ORIF) CALCANEOUS FRACTURE (Right)  Anesthesia type: General  Patient location: PACU  Post pain: Pain level controlled  Post assessment: Post-op Vital signs reviewed  Last Vitals: BP 96/49 mmHg  Pulse 63  Temp(Src) 36.7 C (Oral)  Resp 16  Ht 5\' 6"  (1.676 m)  Wt 140 lb (63.504 kg)  BMI 22.61 kg/m2  SpO2 99%  Post vital signs: Reviewed  Level of consciousness: sedated  Complications: No apparent anesthesia complications

## 2014-05-11 NOTE — Anesthesia Preprocedure Evaluation (Addendum)
Anesthesia Evaluation  Patient identified by MRN, date of birth, ID band Patient awake    Reviewed: Allergy & Precautions, H&P , NPO status , Patient's Chart, lab work & pertinent test results  Airway        Dental   Pulmonary          Cardiovascular     Neuro/Psych    GI/Hepatic   Endo/Other    Renal/GU      Musculoskeletal   Abdominal   Peds  Hematology   Anesthesia Other Findings   Reproductive/Obstetrics                             Anesthesia Physical Anesthesia Plan  ASA: II  Anesthesia Plan: General and Regional   Post-op Pain Management: MAC Combined w/ Regional for Post-op pain   Induction: Intravenous  Airway Management Planned: Oral ETT  Additional Equipment:   Intra-op Plan:   Post-operative Plan: Extubation in OR  Informed Consent: I have reviewed the patients History and Physical, chart, labs and discussed the procedure including the risks, benefits and alternatives for the proposed anesthesia with the patient or authorized representative who has indicated his/her understanding and acceptance.   Dental advisory given  Plan Discussed with: CRNA, Anesthesiologist and Surgeon  Anesthesia Plan Comments:         Anesthesia Quick Evaluation

## 2014-05-11 NOTE — Transfer of Care (Signed)
Immediate Anesthesia Transfer of Care Note  Patient: Carol Jacobs  Procedure(s) Performed: Procedure(s): OPEN REDUCTION INTERNAL FIXATION (ORIF) CALCANEOUS FRACTURE (Right)  Patient Location: PACU  Anesthesia Type:GA combined with regional for post-op pain  Level of Consciousness: awake  Airway & Oxygen Therapy: Patient Spontanous Breathing and Patient connected to nasal cannula oxygen  Post-op Assessment: Report given to PACU RN, Post -op Vital signs reviewed and stable and Patient moving all extremities  Post vital signs: Reviewed and stable  Complications: No apparent anesthesia complications

## 2014-05-11 NOTE — Op Note (Signed)
05/11/2014  5:15 PM  PATIENT:  Carol Jacobs    PRE-OPERATIVE DIAGNOSIS:  Right Calcaneus Fracture  POST-OPERATIVE DIAGNOSIS:  Same  PROCEDURE:  OPEN REDUCTION INTERNAL FIXATION (ORIF) CALCANEOUS FRACTURE  SURGEON:  Nadara MustardUDA,MARCUS V, MD  PHYSICIAN ASSISTANT:None ANESTHESIA:   General  PREOPERATIVE INDICATIONS:  Carol Jacobs is a  53 y.o. female with a diagnosis of Right Calcaneus Fracture who failed conservative measures and elected for surgical management.    The risks benefits and alternatives were discussed with the patient preoperatively including but not limited to the risks of infection, bleeding, nerve injury, cardiopulmonary complications, the need for revision surgery, among others, and the patient was willing to proceed.  OPERATIVE IMPLANTS: Synthes small right calcaneal plate  OPERATIVE FINDINGS: Plastic deformity of the lateral wall  OPERATIVE PROCEDURE: Patient is a 53 year old woman who sustained a calcaneal fracture after a fall from a ladder. Her plain radiograph did not show any intra-articular fracture and she was sent for a urgent CT scan the CT scan did show a Sanders 2 part posterior facet calcaneal fracture. Due to the significant displacement and loss of Boehler's angle patient presents at this time for open reduction internal fixation. Risks and benefits were discussed including infection neurovascular injury nonhealing of the wound not healing the bone need for additional surgery. Patient and her husband state they understand and wish to proceed at this time.  Patient was brought to the operating room and underwent a general anesthetic after adequate levels of anesthesia were obtained patient was placed in the left lateral decubitus position with the right side up and the right lower extremity was prepped using DuraPrep draped into a sterile field an Ioban was used to cover all exposed skin. An extensile lateral incision was made this was carried down to the bone  and subperiosteal elevation of flap was performed. 2 K wires were inserted to hold the flap up. The skin was not touched during the case and was continuously irrigated June the case. Examination the lateral wall was reflected in the joint line was debrided. Patient had plastic deformity of the lateral wall and this was compressed with a bone tamp. The posterior facet was reduced and held in place with a K wire and a small calcaneal plate was then placed laterally and secured with both compression and locking screws. C-arm fluoroscopy verified reduction. The wound was again irrigated the flap was closed using subcutaneous 2-0 Vicryl and the skin was closed using a Algower Donati suture technique. Patient was placed in a soft compressive dressing she was extubated taken to the PACU in stable condition.

## 2014-05-12 DIAGNOSIS — S92001A Unspecified fracture of right calcaneus, initial encounter for closed fracture: Secondary | ICD-10-CM | POA: Diagnosis not present

## 2014-05-12 MED ORDER — GABAPENTIN 300 MG PO CAPS
300.0000 mg | ORAL_CAPSULE | Freq: Every day | ORAL | Status: DC
  Administered 2014-05-12: 300 mg via ORAL
  Filled 2014-05-12 (×2): qty 1

## 2014-05-12 MED ORDER — METHOCARBAMOL 500 MG PO TABS: 500.0000 mg | ORAL_TABLET | Freq: Four times a day (QID) | ORAL | Status: DC | PRN

## 2014-05-12 NOTE — Progress Notes (Signed)
Occupational Therapy Treatment Patient Details Name: Carol Jacobs MRN: [id number] DOB: 02-Dec-1960 Today's Date: 05/12/2014    History of present illness Pt is a 52 y.o. female who fell off a ladder this past Tuesday. Pt s/p ORIF of Rt ankle. R anterior shoulder dislocation. Reduced in ER.   OT comments  Completed all education with pt regarding functional mobility for ADL @ knee walker level; compensatory techniques for ADL and HEP with RUE. Pt verbalized understanding. Pt very appreciative of help. Pt ready to D/C home when medically stable.  Follow Up Recommendations  No OT follow up;Supervision - Intermittent    Equipment Recommendations  None recommended by OT    Recommendations for Other Services      Precautions / Restrictions Precautions Precautions: None Required Braces or Orthoses: Sling Other Brace/Splint: no orders clarification  Restrictions Weight Bearing Restrictions: Yes RUE Weight Bearing: Weight bearing as tolerated RLE Weight Bearing: Non weight bearing       Mobility Bed Mobility Overal bed mobility: Independent             General bed mobility comments: demo good technique   Transfers Overall transfer level: Needs assistance Equipment used: None Transfers: Sit to/from [facility] Sit to Stand: Supervision Stand pivot transfers: Supervision       General transfer comment: good safety techniques. Able to transfer to knee walker using safe technique    Balance     Sitting balance-Leahy Scale: Good       Standing balance-Leahy Scale: Fair                     ADL Overall ADL's : Needs assistance/impaired     Grooming: Set up   Upper Body Bathing: Set up;Sitting   Lower Body Bathing: Set up;Sit to/from stand   Upper Body Dressing : Set up;Supervision/safety;Sitting   Lower Body Dressing: Supervision/safety;Set up;Sit to/from stand   Toilet Transfer: Supervision/safety   Toileting- Marine scientist and Hygiene: Supervision/safety;Sitting/lateral lean       Functional mobility during ADLs: Supervision/safety (knee walker level) General ADL Comments: Pt able to complete functional mobility for ADL @ knee walker level with S. Reviewed UB B/D techniques and sling management. Also educated on positioning of RUE for comfort.      Vision                     Perception     Praxis      Cognition   Behavior During Therapy: WFL for tasks assessed/performed Overall Cognitive Status: Within Functional Limits for tasks assessed                       Extremity/Trunk Assessment  Upper Extremity Assessment Upper Extremity Assessment: RUE deficits/detail RUE Deficits / Details: ant shoulder dislocation. discussed with Dr. Sharol Given who gave vo to be WBAT RUE; pendulums; wall climbs RUE Coordination: decreased gross motor   Lower Extremity Assessment Lower Extremity Assessment: Defer to PT evaluation   Cervical / Trunk Assessment Cervical / Trunk Assessment: Normal    Exercises Total Joint Exercises Long Arc Quad:  (hold 5 sec count) Other Exercises Other Exercises: R UE pendumlums Other Exercises: lap slides Other Exercises: demonstrated wall climb and table slides Other Exercises: AROM R elbow/wrist /hand   Shoulder Instructions       General Comments      Pertinent Vitals/ Pain       Pain Assessment: 0-10 Pain Score: 3  Pain Location:  R ankle Pain Descriptors / Indicators: Aching Pain Intervention(s): Limited activity within patient's tolerance;Monitored during session;Repositioned;Ice applied  Home Living Family/patient expects to be discharged to:: Private residence Living Arrangements: Spouse/significant other;Children Available Help at Discharge: Family;Available 24 hours/day Type of Home: House Home Access: Stairs to enter [employer] of Steps: 1 Entrance Stairs-Rails: None Home Layout: Able to live on main level with  bedroom/bathroom     Bathroom Shower/Tub: Occupational psychologist: Standard Bathroom Accessibility: Yes How Accessible: Accessible via wheelchair;Accessible via walker Home Equipment: Wheelchair - manual;Crutches   Additional Comments: pt has been using wheelchair since Tuesday to enter house       Prior Functioning/Environment Level of Independence: Independent        Comments: prior to fall from ladder    Frequency Min 2X/week     Progress Toward Goals  OT Goals(current goals can now be found in the care plan section)  Progress towards OT goals: Goals met/education completed, patient discharged from OT  Acute Rehab OT Goals Patient Stated Goal: home today if pain is managed OT Goal Formulation: With patient Time For Goal Achievement: 05/19/14 Potential to Achieve Goals: Good ADL Goals Pt/caregiver will Perform Home Exercise Program: Right Upper extremity;Increased ROM;With written HEP provided (pendulums; lap slides; wal climbs; elbow AROM) Additional ADL Goal #1: Pt will complete functional mobility for ADL @ knee walker level with S Additional ADL Goal #2: Pt will complete UB ADL with set up  Plan Discharge plan remains appropriate    Co-evaluation                 End of Session Equipment Utilized During Treatment: Gait belt;Other (comment) (knee walker)   Activity Tolerance Patient tolerated treatment well   Patient Left in bed;with call bell/phone within reach   Nurse Communication Mobility status (ready to D/C)    Functional Assessment Tool Used: clinical judgement Functional Limitation: Self care Self Care Discharge Status [id number]): At least 1 percent but less than 20 percent impaired, limited or restricted   Time: 1415-1445 OT Time Calculation (min): 30 min  Charges: OT G-codes **NOT FOR INPATIENT CLASS** Functional Assessment Tool Used: clinical judgement Functional Limitation: Self care Self Care Discharge Status ([id number]): At least  1 percent but less than 20 percent impaired, limited or restricted OT General Charges $OT Visit: 1 Procedure OT Treatments $Self Care/Home Management : 23-37 mins  [name],HILLARY 05/12/2014, 3:05 PM   [facility], OTR/L  [phone] 05/12/2014

## 2014-05-12 NOTE — Discharge Summary (Signed)
Patient ID: Carol Jacobs MRN: [id number] DOB/AGE: 51-23-1962 51 y.o.  Admit date: 05/11/2014 Discharge date: 05/12/2014  Admission Diagnoses:  Active Problems:   Calcaneal fracture   Discharge Diagnoses:  Same  Past Medical History  Diagnosis Date  . Thyroid disease   . Hypotension     Surgeries: Procedure(s): OPEN REDUCTION INTERNAL FIXATION (ORIF) CALCANEOUS FRACTURE on 05/11/2014   Consultants:    Discharged Condition: Improved  Hospital Course: Carol CondonStacey Orlich is an 53 y.o. female who was admitted 05/11/2014 for operative treatment of<principal problem not specified>. Patient has severe unremitting pain that affects sleep, daily activities, and work/hobbies. After pre-op clearance the patient was taken to the operating room on 05/11/2014 and underwent  Procedure(s): OPEN REDUCTION INTERNAL FIXATION (ORIF) CALCANEOUS FRACTURE.    Patient was given perioperative antibiotics: Anti-infectives    Start     Dose/Rate Route Frequency Ordered Stop   05/12/14 0600  ceFAZolin (ANCEF) IVPB 2 g/50 mL premix  Status:  Discontinued     2 g100 mL/hr over 30 Minutes Intravenous On call to O.R. 05/11/14 1317 05/11/14 1827   05/11/14 2100  ceFAZolin (ANCEF) IVPB 1 g/50 mL premix     1 g100 mL/hr over 30 Minutes Intravenous Every 6 hours 05/11/14 1827 05/12/14 1459   05/11/14 1325  ceFAZolin (ANCEF) 2-3 GM-% IVPB SOLR    Comments:  Girtha HakeKensmoe, Katherine  : cabinet override      05/11/14 1325 05/11/14 1610       Patient was given sequential compression devices, early ambulation, and chemoprophylaxis to prevent DVT.  Patient benefited maximally from hospital stay and there were no complications.    Recent vital signs: Patient Vitals for the past 24 hrs:  BP Temp Temp src Pulse Resp SpO2 Height Weight  05/12/14 0439 (!) 100/37 mmHg 98.7 F (37.1 C) Oral (!) 59 18 100 % - -  05/12/14 0138 (!) 99/45 mmHg 98.6 F (37 C) Oral (!) 59 18 100 % - -  05/11/14 2150 (!) 99/45 mmHg 98.8 F  (37.1 C) - 70 - 100 % - -  05/11/14 1835 (!) 96/49 mmHg 98.1 F (36.7 C) - - 16 99 % - -  05/11/14 1810 (!) 93/46 mmHg - - 63 17 100 % - -  05/11/14 1755 (!) 97/46 mmHg - - 64 12 100 % - -  05/11/14 1740 (!) 105/47 mmHg - - 65 16 100 % - -  05/11/14 1730 - - - 71 (!) 24 98 % - -  05/11/14 1726 106/73 mmHg 98.4 F (36.9 C) - 80 20 99 % - -  05/11/14 1344 (!) 103/36 mmHg 98.5 F (36.9 C) Oral 67 16 99 % 5\' 6"  (1.676 m) 63.504 kg (140 lb)  05/11/14 1334 - 98.5 F (36.9 C) Oral - - - - -     Recent laboratory studies:  Recent Labs  05/11/14 1338  WBC 5.2  HGB 12.2  HCT 37.1  PLT 223     Discharge Medications:     Medication List    TAKE these medications        aspirin EC 325 MG tablet  Take 1 tablet (325 mg total) by mouth daily.     FISH OIL PO  Take 2 capsules by mouth daily.     levothyroxine 100 MCG tablet  Commonly known as:  SYNTHROID, LEVOTHROID  Take 100 mcg by mouth daily before breakfast.     methocarbamol 500 MG tablet  Commonly known as:  ROBAXIN  Take 1  tablet (500 mg total) by mouth every 6 (six) hours as needed for muscle spasms.     methylphenidate 20 MG ER tablet  Commonly known as:  METADATE ER  Take 20 mg by mouth 2 (two) times daily.     ondansetron 4 MG tablet  Commonly known as:  ZOFRAN  Take 1 tablet (4 mg total) by mouth every 6 (six) hours.     ONE-A-DAY WOMENS PO  Take 1 tablet by mouth daily.     oxyCODONE-acetaminophen 5-325 MG per tablet  Commonly known as:  PERCOCET  Take 1-2 tablets by mouth every 6 (six) hours as needed.     oxyCODONE-acetaminophen 5-325 MG per tablet  Commonly known as:  ROXICET  Take 1 tablet by mouth every 4 (four) hours as needed for severe pain.     POTASSIMIN PO  Take 1 tablet by mouth daily as needed.     testosterone 50 MG/5GM (1%) Gel  Commonly known as:  ANDROGEL  Place 5 g onto the skin daily.        Diagnostic Studies: Dg Shoulder Right  05/08/2014   CLINICAL DATA:  Status post  reduction of anterior right shoulder dislocation.  EXAM: RIGHT SHOULDER - 2+ VIEW  COMPARISON:  Plain films right shoulder 05/08/2014 at 14:10 hours  FINDINGS: The humeral head appears reduced.  No new abnormality.  IMPRESSION: Successful reduction of shoulder dislocation.   Electronically Signed   By: Drusilla Kannerhomas  Dalessio M.D.   On: 05/08/2014 15:59   Dg Shoulder Right  05/08/2014   CLINICAL DATA:  Right shoulder pain, fell off ladder this morning  EXAM: RIGHT SHOULDER - 2+ VIEW  COMPARISON:  None.  FINDINGS: Two views of the right shoulder submitted. There is anterior subluxation of humeral head from glenohumeral joint. No acute fracture.  IMPRESSION: Anterior shoulder dislocation.   Electronically Signed   By: Natasha MeadLiviu  Pop M.D.   On: 05/08/2014 15:05   Dg Ankle Complete Right  05/08/2014   CLINICAL DATA:  Larey SeatFell off ladder this morning, right shoulder pain, right ankle pain  EXAM: RIGHT ANKLE - COMPLETE 3+ VIEW  COMPARISON:  None.  FINDINGS: Three views of the right ankle submitted. No ankle fracture or subluxation. Ankle mortise is preserved. There is minimal impacted probable comminuted fracture of the right calcaneus.  IMPRESSION: No ankle fracture. Minimal impacted probable comminuted fracture of the right calcaneus.   Electronically Signed   By: Natasha MeadLiviu  Pop M.D.   On: 05/08/2014 14:27   Ct Ankle Right Wo Contrast  05/10/2014   CLINICAL DATA:  Status post fall from a ladder 05/08/2014 with a calcaneal fracture.  EXAM: CT OF THE RIGHT ANKLE WITHOUT CONTRAST  TECHNIQUE: Multidetector CT imaging of the right ankle was performed according to the standard protocol. Multiplanar CT image reconstructions were also generated.  COMPARISON:  Plain films of the right ankle 05/08/2014.  FINDINGS: As seen on plain films, the patient has a comminuted fracture of the calcaneus. The main component of the fracture is oblique in orientation extending from the posterior and medial calcaneus in an anterior and lateral  orientation through the posterior facet of the subtalar joint and the body of the calcaneus laterally. At the lateral aspect of the posterior facet, the fracture is distracted 0.7 cm from left right for a length of 1.2 cm AP. There is a nondisplaced component of the fracture through the calcaneus at the middle facet of the subtalar joint. The fracture does not involve the calcaneocuboid joint. No  other fracture is identified.  Extensive soft tissue swelling and hematoma are seen about the ankle. No tendon entrapment is identified.  IMPRESSION: Comminuted fracture of the calcaneus most consistent with a Sanders type 2A injury. As described above there is a markedly distracted component through lateral aspect of the posterior facet of the subtalar joint and a nondisplaced component through the middle facet of the subtalar joint.   Electronically Signed   By: Drusilla Kanner M.D.   On: 05/10/2014 11:07    Disposition: 01-Home or Self Care      Discharge Instructions    Call MD / Call 911    Complete by:  As directed   If you experience chest pain or shortness of breath, CALL 911 and be transported to the hospital emergency room.  If you develope a fever above 101 F, pus (white drainage) or increased drainage or redness at the wound, or calf pain, call your surgeon's office.     Call MD / Call 911    Complete by:  As directed   If you experience chest pain or shortness of breath, CALL 911 and be transported to the hospital emergency room.  If you develope a fever above 101 F, pus (white drainage) or increased drainage or redness at the wound, or calf pain, call your surgeon's office.     Constipation Prevention    Complete by:  As directed   Drink plenty of fluids.  Prune juice may be helpful.  You may use a stool softener, such as Colace (over the counter) 100 mg twice a day.  Use MiraLax (over the counter) for constipation as needed.     Constipation Prevention    Complete by:  As directed   Drink  plenty of fluids.  Prune juice may be helpful.  You may use a stool softener, such as Colace (over the counter) 100 mg twice a day.  Use MiraLax (over the counter) for constipation as needed.     Diet - low sodium heart healthy    Complete by:  As directed      Diet - low sodium heart healthy    Complete by:  As directed      Discharge instructions    Complete by:  As directed   No weight bearing on your right foot at all. Ice and elevation intermittently for swelling. Keep your dressing clean and dry. You should take a full strength 325 mg aspirin daily. You can come out of you sling occasionally to move your elbow, wrist and hand. No reaching overhead or behind you with your right arm.     Discharge patient    Complete by:  As directed      For home use only DME Crutches    Complete by:  As directed      Increase activity slowly as tolerated    Complete by:  As directed      Increase activity slowly as tolerated    Complete by:  As directed      Non weight bearing    Complete by:  As directed   Laterality:  right  Extremity:  Lower     Post-op shoe    Complete by:  As directed            Follow-up Information    Follow up with DUDA,MARCUS V, MD In 2 weeks.   Specialty:  Orthopedic Surgery   Contact information:   [address] [place] [address] [phone]  SignedKathryne Hitch 05/12/2014, 8:14 AM

## 2014-05-12 NOTE — Progress Notes (Signed)
Subjective: 1 Day Post-Op Procedure(s) (LRB): OPEN REDUCTION INTERNAL FIXATION (ORIF) CALCANEOUS FRACTURE (Right) Patient reports pain as moderate.    Objective: Vital signs in last 24 hours: Temp:  [98.1 F (36.7 C)-98.8 F (37.1 C)] 98.7 F (37.1 C) (12/19 0439) Pulse Rate:  [59-80] 59 (12/19 0439) Resp:  [12-24] 18 (12/19 0439) BP: (93-106)/(36-73) 100/37 mmHg (12/19 0439) SpO2:  [98 %-100 %] 100 % (12/19 0439) Weight:  [63.504 kg (140 lb)] 63.504 kg (140 lb) (12/18 1344)  Intake/Output from previous day: 12/18 0701 - 12/19 0700 In: 1400 [P.O.:600; I.V.:800] Out: -  Intake/Output this shift:     Recent Labs  05/11/14 1338  HGB 12.2    Recent Labs  05/11/14 1338  WBC 5.2  RBC 4.19  HCT 37.1  PLT 223   No results for input(s): NA, K, CL, CO2, BUN, CREATININE, GLUCOSE, CALCIUM in the last 72 hours. No results for input(s): LABPT, INR in the last 72 hours.  Sensation intact distally Intact pulses distally Incision: dressing C/D/I  Assessment/Plan: 1 Day Post-Op Procedure(s) (LRB): OPEN REDUCTION INTERNAL FIXATION (ORIF) CALCANEOUS FRACTURE (Right) Discharge to home today if pain gets under better control vs waiting until tomorrow. Non-weight bearing on her right foot.  BLACKMAN,CHRISTOPHER Y 05/12/2014, 8:08 AM

## 2014-05-12 NOTE — Evaluation (Signed)
Physical Therapy Evaluation Patient Details Name: Carol CondonStacey Kleeman MRN: [id number] DOB: [birth year] Today's Date: 05/12/2014   History of Present Illness  Pt is a 53 y.o. female who fell off a ladder this past Tuesday. Pt s/p ORIF of Rt ankle.   Clinical Impression  Patient evaluated by Physical Therapy with no further acute PT needs identified. All education has been completed and the patient has no further questions. Pt has been managing and transferring at home since Tuesday NWB. Pt at supervision level for transfers mainly due to c/o Dizziness, pt demo good balance and safety awareness.See below for any follow-up Physial Therapy or equipment needs. Pt to benefit from OT evaluation regarding shoulder management. PT is signing off. Thank you for this referral.     Follow Up Recommendations No PT follow up;Supervision/Assistance - 24 hour    Equipment Recommendations  Other (comment) (may need elevated leg rest for Rt LE )    Recommendations for Other Services OT consult     Precautions / Restrictions Precautions Precautions: None Required Braces or Orthoses: Sling;Other Brace/Splint Other Brace/Splint: no orders clarification  Restrictions Weight Bearing Restrictions: Yes RLE Weight Bearing: Non weight bearing      Mobility  Bed Mobility Overal bed mobility: Independent             General bed mobility comments: demo good technique   Transfers Overall transfer level: Needs assistance Equipment used: None Transfers: Sit to/from UGI CorporationStand;Stand Pivot Transfers Sit to Stand: Supervision Stand pivot transfers: Supervision       General transfer comment: pt c/o dizziness due to recent pain meds; supervision for safety; demo good ability to maintain NWB status   Ambulation/Gait                Stairs            Wheelchair Mobility    Modified Rankin (Stroke Patients Only)       Balance Overall balance assessment: Needs assistance Sitting-balance  support: Feet supported;No upper extremity supported Sitting balance-Leahy Scale: Good     Standing balance support: During functional activity;Single extremity supported Standing balance-Leahy Scale: Poor Standing balance comment: UE supported at all times to maintain NWB status and balance                              Pertinent Vitals/Pain Pain Assessment: No/denies pain    Home Living Family/patient expects to be discharged to:: Private residence Living Arrangements: Spouse/significant other;Children Available Help at Discharge: Family;Available 24 hours/day Type of Home: House Home Access: Stairs to enter Entrance Stairs-Rails: None Entrance Stairs-Number of Steps: 1 Home Layout: Able to live on main level with bedroom/bathroom Home Equipment: Wheelchair - manual;Crutches Additional Comments: pt has been using wheelchair since Tuesday to enter house     Prior Function Level of Independence: Independent         Comments: prior to fall from ladder      Hand Dominance        Extremity/Trunk Assessment   Upper Extremity Assessment: Defer to OT evaluation           Lower Extremity Assessment: Overall WFL for tasks assessed      Cervical / Trunk Assessment: Normal  Communication   Communication: No difficulties  Cognition Arousal/Alertness: Awake/alert Behavior During Therapy: WFL for tasks assessed/performed Overall Cognitive Status: Within Functional Limits for tasks assessed  General Comments General comments (skin integrity, edema, etc.): pt with multiple questions regarding shoulder management; would benefit from OT evaluation ; discussed home setup and safety; pt with all DME needed    Exercises Total Joint Exercises Quad Sets: AROM;Right;5 reps;Seated Heel Slides: AROM;Strengthening;Right;5 reps;Seated Hip ABduction/ADduction: AROM;Strengthening;Right;5 reps;Seated Straight Leg Raises: AROM;Right;5  reps;Seated Long Arc Quad: AROM;Strengthening;Right;5 reps;Other (comment) (hold 5 sec count)      Assessment/Plan    PT Assessment Patent does not need any further PT services  PT Diagnosis Difficulty walking;Acute pain   PT Problem List    PT Treatment Interventions     PT Goals (Current goals can be found in the Care Plan section) Acute Rehab PT Goals Patient Stated Goal: home today if pain is managed PT Goal Formulation: All assessment and education complete, DC therapy    Frequency     Barriers to discharge        Co-evaluation               End of Session Equipment Utilized During Treatment: Other (comment) (sling) Activity Tolerance: Other (comment) (c/o dizziness due to pain meds) Patient left: in chair;with call bell/phone within reach Nurse Communication: Mobility status;Precautions;Weight bearing status    Functional Assessment Tool Used: clinical judgement  Functional Limitation: Mobility: Walking and moving around Mobility: Walking and Moving Around Current Status ([id number](G8978): At least 1 percent but less than 20 percent impaired, limited or restricted Mobility: Walking and Moving Around Goal Status [phone]([id number]): At least 1 percent but less than 20 percent impaired, limited or restricted Mobility: Walking and Moving Around Discharge Status [phone]([id number]): At least 1 percent but less than 20 percent impaired, limited or restricted    Time: 1002-1018 PT Time Calculation (min) (ACUTE ONLY): 16 min   Charges:   PT Evaluation $Initial PT Evaluation Tier I: 1 Procedure PT Treatments $Therapeutic Exercise: 8-22 mins   PT G Codes:   Functional Assessment Tool Used: clinical judgement  Functional Limitation: Mobility: Walking and moving around    JansenWest, East Gull LakeBrittany N, South CarolinaPT  [phone] 05/12/2014, 10:35 AM

## 2014-05-12 NOTE — Progress Notes (Signed)
Patient still unable to control pain with oral medication only. Requiring IV medication for breakthrough pain throughout the day. States that she is uncomfortable going home at this time with the fear we are not yet ahead of the pain. Will cancel discharge and have MD round on her tomorrow with hopes to discharge in the morning and better pain control.

## 2014-05-12 NOTE — Progress Notes (Signed)
Occupational Therapy Evaluation Patient Details Name: Cruz CondonStacey Hautala MRN: [id number] DOB: [birth year] Today's Date: 05/12/2014    History of Present Illness Pt is a 53 y.o. female who fell off a ladder this past Tuesday. Pt s/p ORIF of Rt ankle.    Clinical Impression   PTA, pt independent with all ADL and mobility. Discussed with Dr.Duda. Pt can be WBAT with RUE and OK for pendulums and wall climbs. Will see agin for mobility for ADL with knee walker and teach HEP for RUE and ADL management.     Follow Up Recommendations  No OT follow up;Supervision - Intermittent    Equipment Recommendations  None recommended by OT    Recommendations for Other Services       Precautions / Restrictions Precautions Precautions: None Required Braces or Orthoses: Sling;Other Brace/Splint Other Brace/Splint: no orders clarification  Restrictions Weight Bearing Restrictions: Yes RUE Weight Bearing: Weight bearing as tolerated RLE Weight Bearing: Non weight bearing      Mobility Bed Mobility Overal bed mobility: Independent             General bed mobility comments: demo good technique   Transfers Overall transfer level: Needs assistance Equipment used: None Transfers: Sit to/from UGI CorporationStand;Stand Pivot Transfers Sit to Stand: Supervision Stand pivot transfers: Supervision       General transfer comment    Balance Overall balance assessment: Needs assistance Sitting-balance support: Feet supported;No upper extremity supported Sitting balance-Leahy Scale: Good     Standing balance support: During functional activity;Single extremity supported Standing balance-Leahy Scale: Fair Stand                            ADL Overall ADL's : Needs assistance/impaired     Grooming: Set up   Upper Body Bathing: Set up;Sitting   Lower Body Bathing: Set up;Sit to/from stand   Upper Body Dressing : Set up;Supervision/safety;Sitting   Lower Body Dressing:  Supervision/safety;Set up;Sit to/from stand   Toilet Transfer: Supervision/safety   Toileting- ArchitectClothing Manipulation and Hygiene: Supervision/safety;Sitting/lateral lean       Functional mobility during ADLs: Supervision/safety General ADL Comments: Pt has been managing  RUE since Tuesday with sling     Vision                     Perception     Praxis      Pertinent Vitals/Pain Pain Assessment: 0-10 Pain Score: 3  Pain Location: R ankle Pain Descriptors / Indicators: Aching Pain Intervention(s): Limited activity within patient's tolerance;Monitored during session;Repositioned     Hand Dominance Right   Extremity/Trunk Assessment Upper Extremity Assessment Upper Extremity Assessment: RUE deficits/detail RUE Deficits / Details: ant shoulder dislocation. discussed with Dr. Lajoyce Cornersuda who gave vo to be WBAT RUE; pendulums; wall climbs RUE Coordination: decreased gross motor   Lower Extremity Assessment Lower Extremity Assessment: Defer to PT evaluation   Cervical / Trunk Assessment Cervical / Trunk Assessment: Normal   Communication Communication Communication: No difficulties   Cognition Arousal/Alertness: Awake/alert Behavior During Therapy: WFL for tasks assessed/performed Overall Cognitive Status: Within Functional Limits for tasks assessed                     General Comments       Exercises Exercises: Total Joint     Shoulder Instructions      Home Living Family/patient expects to be discharged to:: Private residence Living Arrangements: Spouse/significant other;Children Available Help at Discharge: Family;Available 24  hours/day Type of Home: House Home Access: Stairs to enter Entergy CorporationEntrance Stairs-Number of Steps: 1 Entrance Stairs-Rails: None Home Layout: Able to live on main level with bedroom/bathroom     Bathroom Shower/Tub: Producer, television/film/videoWalk-in shower   Bathroom Toilet: Standard Bathroom Accessibility: Yes How Accessible: Accessible via  wheelchair;Accessible via walker Home Equipment: Wheelchair - manual;Crutches   Additional Comments: pt has been using wheelchair since Tuesday to enter house       Prior Functioning/Environment Level of Independence: Independent        Comments: prior to fall from ladder     OT Diagnosis: Generalized weakness;Acute pain   OT Problem List: Decreased strength;Decreased range of motion;Decreased coordination;Decreased knowledge of use of DME or AE;Decreased knowledge of precautions;Impaired UE functional use;Pain   OT Treatment/Interventions: Self-care/ADL training;Therapeutic exercise;DME and/or AE instruction;Therapeutic activities;Patient/family education    OT Goals(Current goals can be found in the care plan section) Acute Rehab OT Goals Patient Stated Goal: home today if pain is managed OT Goal Formulation: With patient Time For Goal Achievement: 05/19/14 Potential to Achieve Goals: Good  OT Frequency: Min 2X/week   Barriers to D/C:            Co-evaluation              End of Session Equipment Utilized During Treatment: Gait belt Nurse Communication: Mobility status  Activity Tolerance: Patient tolerated treatment well Patient left: in bed;with call bell/phone within reach   Time: 1335-1352 OT Time Calculation (min): 17 min Charges:  OT General Charges $OT Visit: 1 Procedure OT Evaluation $Initial OT Evaluation Tier I: 1 Procedure OT Treatments $Self Care/Home Management : 8-22 mins G-Codes: OT G-codes **NOT FOR INPATIENT CLASS** Functional Assessment Tool Used: clinical judgement Functional Limitation: Self care Self Care Current Status (Z6109(G8987): At least 1 percent but less than 20 percent impaired, limited or restricted Self Care Goal Status (U0454(G8988): At least 1 percent but less than 20 percent impaired, limited or restricted  Pina Sirianni,HILLARY 05/12/2014, 2:11 PM   [facility] Durene Dodge, OTR/L  [phone] 05/12/2014

## 2014-05-13 DIAGNOSIS — S92001A Unspecified fracture of right calcaneus, initial encounter for closed fracture: Secondary | ICD-10-CM | POA: Diagnosis not present

## 2014-05-13 MED ORDER — GABAPENTIN 300 MG PO CAPS: 300.0000 mg | ORAL_CAPSULE | Freq: Every day | ORAL | Status: DC

## 2014-05-13 NOTE — Progress Notes (Signed)
Misty StanleyStacey Barber discharged home per MD order. Discharge instructions reviewed and discussed with patient. All questions and concerns answered. Copy of instructions and scripts given to patient. IV removed.  Patient escorted to car by staff in a wheelchair. No distress noted upon discharge.   Rosita FireScott, Audra Bellard R 05/13/2014 12:18 PM

## 2014-05-13 NOTE — Progress Notes (Signed)
Patient and patients husband requested Neurontin to assist with nerve pain. RN called MD. Dr. Magnus IvanBlackman returned called and ordered 300 mg Neurontin daily at bedtime. Nursing will continue to monitor.

## 2014-05-13 NOTE — Progress Notes (Signed)
Orthopedic Tech Progress Note Patient Details:  Carol CondonStacey Jacobs 1960-12-27 [id number] Bone foam delivered to patient for discharge Ortho Devices Type of Ortho Device: Other (comment) Ortho Device/Splint Location: Bone Foam Ortho Device/Splint Interventions: Ordered   [place] R Thompson 05/13/2014, 12:07 PM

## 2014-05-13 NOTE — Discharge Summary (Signed)
Patient ID: Carol CondonStacey Jacobs MRN: [id number] DOB/AGE: 08-31-1960 53 y.o.  Admit date: 05/11/2014 Discharge date: 05/13/2014  Admission Diagnoses:  Active Problems:   Calcaneal fracture   Discharge Diagnoses:  Same  Past Medical History  Diagnosis Date  . Thyroid disease   . Hypotension     Surgeries: Procedure(s): OPEN REDUCTION INTERNAL FIXATION (ORIF) CALCANEOUS FRACTURE on 05/11/2014   Consultants:    Discharged Condition: Improved  Hospital Course: Carol CondonStacey Barraco is an 53 y.o. female who was admitted 05/11/2014 for operative treatment of<principal problem not specified>. Patient has severe unremitting pain that affects sleep, daily activities, and work/hobbies. After pre-op clearance the patient was taken to the operating room on 05/11/2014 and underwent  Procedure(s): OPEN REDUCTION INTERNAL FIXATION (ORIF) CALCANEOUS FRACTURE.    Patient was given perioperative antibiotics: Anti-infectives    Start     Dose/Rate Route Frequency Ordered Stop   05/12/14 0600  ceFAZolin (ANCEF) IVPB 2 g/50 mL premix  Status:  Discontinued     2 g100 mL/hr over 30 Minutes Intravenous On call to O.R. 05/11/14 1317 05/11/14 1827   05/11/14 2100  ceFAZolin (ANCEF) IVPB 1 g/50 mL premix     1 g100 mL/hr over 30 Minutes Intravenous Every 6 hours 05/11/14 1827 05/12/14 1000   05/11/14 1325  ceFAZolin (ANCEF) 2-3 GM-% IVPB SOLR    Comments:  Girtha HakeKensmoe, Katherine  : cabinet override      05/11/14 1325 05/11/14 1610       Patient was given sequential compression devices, early ambulation, and chemoprophylaxis to prevent DVT.  Patient benefited maximally from hospital stay and there were no complications.    Recent vital signs: Patient Vitals for the past 24 hrs:  BP Temp Temp src Pulse Resp SpO2  05/13/14 0545 (!) 94/40 mmHg 98.4 F (36.9 C) Oral 62 18 97 %  05/12/14 2014 (!) 102/35 mmHg 98 F (36.7 C) Oral (!) 58 18 100 %  05/12/14 1303 (!) 85/38 mmHg 98.1 F (36.7 C) Oral (!) 56 - 100 %      Recent laboratory studies:  Recent Labs  05/11/14 1338  WBC 5.2  HGB 12.2  HCT 37.1  PLT 223     Discharge Medications:     Medication List    TAKE these medications        aspirin EC 325 MG tablet  Take 1 tablet (325 mg total) by mouth daily.     FISH OIL PO  Take 2 capsules by mouth daily.     gabapentin 300 MG capsule  Commonly known as:  NEURONTIN  Take 1 capsule (300 mg total) by mouth at bedtime.     levothyroxine 100 MCG tablet  Commonly known as:  SYNTHROID, LEVOTHROID  Take 100 mcg by mouth daily before breakfast.     methocarbamol 500 MG tablet  Commonly known as:  ROBAXIN  Take 1 tablet (500 mg total) by mouth every 6 (six) hours as needed for muscle spasms.     methylphenidate 20 MG ER tablet  Commonly known as:  METADATE ER  Take 20 mg by mouth 2 (two) times daily.     ondansetron 4 MG tablet  Commonly known as:  ZOFRAN  Take 1 tablet (4 mg total) by mouth every 6 (six) hours.     ONE-A-DAY WOMENS PO  Take 1 tablet by mouth daily.     oxyCODONE-acetaminophen 5-325 MG per tablet  Commonly known as:  PERCOCET  Take 1-2 tablets by mouth every 6 (six) hours as  needed.     oxyCODONE-acetaminophen 5-325 MG per tablet  Commonly known as:  ROXICET  Take 1 tablet by mouth every 4 (four) hours as needed for severe pain.     POTASSIMIN PO  Take 1 tablet by mouth daily as needed.     testosterone 50 MG/5GM (1%) Gel  Commonly known as:  ANDROGEL  Place 5 g onto the skin daily.        Diagnostic Studies: Dg Shoulder Right  05/08/2014   CLINICAL DATA:  Status post reduction of anterior right shoulder dislocation.  EXAM: RIGHT SHOULDER - 2+ VIEW  COMPARISON:  Plain films right shoulder 05/08/2014 at 14:10 hours  FINDINGS: The humeral head appears reduced.  No new abnormality.  IMPRESSION: Successful reduction of shoulder dislocation.   Electronically Signed   By: Drusilla Kannerhomas  Dalessio M.D.   On: 05/08/2014 15:59   Dg Shoulder Right  05/08/2014    CLINICAL DATA:  Right shoulder pain, fell off ladder this morning  EXAM: RIGHT SHOULDER - 2+ VIEW  COMPARISON:  None.  FINDINGS: Two views of the right shoulder submitted. There is anterior subluxation of humeral head from glenohumeral joint. No acute fracture.  IMPRESSION: Anterior shoulder dislocation.   Electronically Signed   By: Natasha MeadLiviu  Pop M.D.   On: 05/08/2014 15:05   Dg Ankle Complete Right  05/08/2014   CLINICAL DATA:  Larey SeatFell off ladder this morning, right shoulder pain, right ankle pain  EXAM: RIGHT ANKLE - COMPLETE 3+ VIEW  COMPARISON:  None.  FINDINGS: Three views of the right ankle submitted. No ankle fracture or subluxation. Ankle mortise is preserved. There is minimal impacted probable comminuted fracture of the right calcaneus.  IMPRESSION: No ankle fracture. Minimal impacted probable comminuted fracture of the right calcaneus.   Electronically Signed   By: Natasha MeadLiviu  Pop M.D.   On: 05/08/2014 14:27   Ct Ankle Right Wo Contrast  05/10/2014   CLINICAL DATA:  Status post fall from a ladder 05/08/2014 with a calcaneal fracture.  EXAM: CT OF THE RIGHT ANKLE WITHOUT CONTRAST  TECHNIQUE: Multidetector CT imaging of the right ankle was performed according to the standard protocol. Multiplanar CT image reconstructions were also generated.  COMPARISON:  Plain films of the right ankle 05/08/2014.  FINDINGS: As seen on plain films, the patient has a comminuted fracture of the calcaneus. The main component of the fracture is oblique in orientation extending from the posterior and medial calcaneus in an anterior and lateral orientation through the posterior facet of the subtalar joint and the body of the calcaneus laterally. At the lateral aspect of the posterior facet, the fracture is distracted 0.7 cm from left right for a length of 1.2 cm AP. There is a nondisplaced component of the fracture through the calcaneus at the middle facet of the subtalar joint. The fracture does not involve the calcaneocuboid  joint. No other fracture is identified.  Extensive soft tissue swelling and hematoma are seen about the ankle. No tendon entrapment is identified.  IMPRESSION: Comminuted fracture of the calcaneus most consistent with a Sanders type 2A injury. As described above there is a markedly distracted component through lateral aspect of the posterior facet of the subtalar joint and a nondisplaced component through the middle facet of the subtalar joint.   Electronically Signed   By: Drusilla Kannerhomas  Dalessio M.D.   On: 05/10/2014 11:07    Disposition: 01-Home or Self Care      Discharge Instructions    Call MD / Call 911  Complete by:  As directed   If you experience chest pain or shortness of breath, CALL 911 and be transported to the hospital emergency room.  If you develope a fever above 101 F, pus (white drainage) or increased drainage or redness at the wound, or calf pain, call your surgeon's office.     Call MD / Call 911    Complete by:  As directed   If you experience chest pain or shortness of breath, CALL 911 and be transported to the hospital emergency room.  If you develope a fever above 101 F, pus (white drainage) or increased drainage or redness at the wound, or calf pain, call your surgeon's office.     Constipation Prevention    Complete by:  As directed   Drink plenty of fluids.  Prune juice may be helpful.  You may use a stool softener, such as Colace (over the counter) 100 mg twice a day.  Use MiraLax (over the counter) for constipation as needed.     Constipation Prevention    Complete by:  As directed   Drink plenty of fluids.  Prune juice may be helpful.  You may use a stool softener, such as Colace (over the counter) 100 mg twice a day.  Use MiraLax (over the counter) for constipation as needed.     Diet - low sodium heart healthy    Complete by:  As directed      Diet - low sodium heart healthy    Complete by:  As directed      Discharge instructions    Complete by:  As directed   No  weight bearing on your right foot at all. Ice and elevation intermittently for swelling. Keep your dressing clean and dry. You should take a full strength 325 mg aspirin daily. You can come out of you sling occasionally to move your elbow, wrist and hand. No reaching overhead or behind you with your right arm.     Discharge patient    Complete by:  As directed      Discharge patient    Complete by:  As directed      For home use only DME Crutches    Complete by:  As directed      Increase activity slowly as tolerated    Complete by:  As directed      Increase activity slowly as tolerated    Complete by:  As directed      Non weight bearing    Complete by:  As directed   Laterality:  right  Extremity:  Lower     Post-op shoe    Complete by:  As directed            Follow-up Information    Follow up with DUDA,MARCUS V, MD In 2 weeks.   Specialty:  Orthopedic Surgery   Contact information:   [address] [place] [address] [phone]        Signed: Kathryne Hitch 05/13/2014, 10:23 AM

## 2014-05-15 ENCOUNTER — Encounter (HOSPITAL_COMMUNITY): Payer: Self-pay | Admitting: Orthopedic Surgery

## 2014-05-29 ENCOUNTER — Ambulatory Visit (INDEPENDENT_AMBULATORY_CARE_PROVIDER_SITE_OTHER): Payer: BLUE CROSS/BLUE SHIELD | Admitting: Physical Therapy

## 2014-05-29 DIAGNOSIS — M2560 Stiffness of unspecified joint, not elsewhere classified: Secondary | ICD-10-CM

## 2014-05-29 DIAGNOSIS — S92011A Displaced fracture of body of right calcaneus, initial encounter for closed fracture: Secondary | ICD-10-CM

## 2014-05-29 DIAGNOSIS — M25511 Pain in right shoulder: Secondary | ICD-10-CM

## 2014-05-29 DIAGNOSIS — S92061D Displaced intraarticular fracture of right calcaneus, subsequent encounter for fracture with routine healing: Secondary | ICD-10-CM

## 2014-05-29 DIAGNOSIS — M6281 Muscle weakness (generalized): Secondary | ICD-10-CM

## 2014-05-29 DIAGNOSIS — M2550 Pain in unspecified joint: Secondary | ICD-10-CM

## 2014-05-29 DIAGNOSIS — S43004A Unspecified dislocation of right shoulder joint, initial encounter: Secondary | ICD-10-CM

## 2014-05-29 DIAGNOSIS — M79671 Pain in right foot: Secondary | ICD-10-CM

## 2014-05-29 DIAGNOSIS — R600 Localized edema: Secondary | ICD-10-CM

## 2014-05-31 ENCOUNTER — Encounter (INDEPENDENT_AMBULATORY_CARE_PROVIDER_SITE_OTHER): Payer: BLUE CROSS/BLUE SHIELD | Admitting: Physical Therapy

## 2014-05-31 DIAGNOSIS — S43004A Unspecified dislocation of right shoulder joint, initial encounter: Secondary | ICD-10-CM

## 2014-05-31 DIAGNOSIS — R600 Localized edema: Secondary | ICD-10-CM

## 2014-05-31 DIAGNOSIS — M79671 Pain in right foot: Secondary | ICD-10-CM

## 2014-05-31 DIAGNOSIS — M25511 Pain in right shoulder: Secondary | ICD-10-CM

## 2014-05-31 DIAGNOSIS — M6281 Muscle weakness (generalized): Secondary | ICD-10-CM

## 2014-05-31 DIAGNOSIS — M2560 Stiffness of unspecified joint, not elsewhere classified: Secondary | ICD-10-CM

## 2014-05-31 DIAGNOSIS — S92011A Displaced fracture of body of right calcaneus, initial encounter for closed fracture: Secondary | ICD-10-CM

## 2014-05-31 DIAGNOSIS — M2550 Pain in unspecified joint: Secondary | ICD-10-CM

## 2014-05-31 DIAGNOSIS — S92061D Displaced intraarticular fracture of right calcaneus, subsequent encounter for fracture with routine healing: Secondary | ICD-10-CM

## 2014-06-05 ENCOUNTER — Encounter (INDEPENDENT_AMBULATORY_CARE_PROVIDER_SITE_OTHER): Payer: BLUE CROSS/BLUE SHIELD | Admitting: Physical Therapy

## 2014-06-05 DIAGNOSIS — M2560 Stiffness of unspecified joint, not elsewhere classified: Secondary | ICD-10-CM

## 2014-06-05 DIAGNOSIS — S92011A Displaced fracture of body of right calcaneus, initial encounter for closed fracture: Secondary | ICD-10-CM

## 2014-06-05 DIAGNOSIS — R600 Localized edema: Secondary | ICD-10-CM

## 2014-06-05 DIAGNOSIS — S92061D Displaced intraarticular fracture of right calcaneus, subsequent encounter for fracture with routine healing: Secondary | ICD-10-CM

## 2014-06-05 DIAGNOSIS — M2550 Pain in unspecified joint: Secondary | ICD-10-CM

## 2014-06-05 DIAGNOSIS — M79671 Pain in right foot: Secondary | ICD-10-CM

## 2014-06-05 DIAGNOSIS — M6281 Muscle weakness (generalized): Secondary | ICD-10-CM

## 2014-06-05 DIAGNOSIS — M25511 Pain in right shoulder: Secondary | ICD-10-CM

## 2014-06-05 DIAGNOSIS — S43004A Unspecified dislocation of right shoulder joint, initial encounter: Secondary | ICD-10-CM

## 2014-06-07 ENCOUNTER — Encounter: Payer: BLUE CROSS/BLUE SHIELD | Admitting: Physical Therapy

## 2014-06-11 ENCOUNTER — Encounter: Payer: BLUE CROSS/BLUE SHIELD | Admitting: Physical Therapy

## 2014-06-12 ENCOUNTER — Encounter (INDEPENDENT_AMBULATORY_CARE_PROVIDER_SITE_OTHER): Payer: BLUE CROSS/BLUE SHIELD | Admitting: Physical Therapy

## 2014-06-12 DIAGNOSIS — R600 Localized edema: Secondary | ICD-10-CM

## 2014-06-12 DIAGNOSIS — M79671 Pain in right foot: Secondary | ICD-10-CM

## 2014-06-12 DIAGNOSIS — M6281 Muscle weakness (generalized): Secondary | ICD-10-CM

## 2014-06-12 DIAGNOSIS — S43004A Unspecified dislocation of right shoulder joint, initial encounter: Secondary | ICD-10-CM

## 2014-06-12 DIAGNOSIS — M2550 Pain in unspecified joint: Secondary | ICD-10-CM

## 2014-06-12 DIAGNOSIS — S92061D Displaced intraarticular fracture of right calcaneus, subsequent encounter for fracture with routine healing: Secondary | ICD-10-CM

## 2014-06-12 DIAGNOSIS — M2560 Stiffness of unspecified joint, not elsewhere classified: Secondary | ICD-10-CM

## 2014-06-12 DIAGNOSIS — S92011A Displaced fracture of body of right calcaneus, initial encounter for closed fracture: Secondary | ICD-10-CM

## 2014-06-12 DIAGNOSIS — M25511 Pain in right shoulder: Secondary | ICD-10-CM

## 2014-06-14 ENCOUNTER — Encounter: Payer: BLUE CROSS/BLUE SHIELD | Admitting: Physical Therapy

## 2014-06-18 ENCOUNTER — Encounter: Payer: BLUE CROSS/BLUE SHIELD | Admitting: Physical Therapy

## 2014-06-19 ENCOUNTER — Encounter (INDEPENDENT_AMBULATORY_CARE_PROVIDER_SITE_OTHER): Payer: BLUE CROSS/BLUE SHIELD | Admitting: Physical Therapy

## 2014-06-19 DIAGNOSIS — R600 Localized edema: Secondary | ICD-10-CM

## 2014-06-19 DIAGNOSIS — M2560 Stiffness of unspecified joint, not elsewhere classified: Secondary | ICD-10-CM

## 2014-06-19 DIAGNOSIS — S43004A Unspecified dislocation of right shoulder joint, initial encounter: Secondary | ICD-10-CM

## 2014-06-19 DIAGNOSIS — M6281 Muscle weakness (generalized): Secondary | ICD-10-CM

## 2014-06-19 DIAGNOSIS — M25511 Pain in right shoulder: Secondary | ICD-10-CM

## 2014-06-19 DIAGNOSIS — M79671 Pain in right foot: Secondary | ICD-10-CM

## 2014-06-19 DIAGNOSIS — M2550 Pain in unspecified joint: Secondary | ICD-10-CM

## 2014-06-19 DIAGNOSIS — S92061D Displaced intraarticular fracture of right calcaneus, subsequent encounter for fracture with routine healing: Secondary | ICD-10-CM

## 2014-06-21 ENCOUNTER — Encounter: Payer: BLUE CROSS/BLUE SHIELD | Admitting: Physical Therapy

## 2014-06-25 ENCOUNTER — Encounter: Payer: BLUE CROSS/BLUE SHIELD | Admitting: Physical Therapy

## 2014-06-25 ENCOUNTER — Encounter (INDEPENDENT_AMBULATORY_CARE_PROVIDER_SITE_OTHER): Payer: BLUE CROSS/BLUE SHIELD | Admitting: Physical Therapy

## 2014-06-25 DIAGNOSIS — S92061D Displaced intraarticular fracture of right calcaneus, subsequent encounter for fracture with routine healing: Secondary | ICD-10-CM

## 2014-06-25 DIAGNOSIS — M25511 Pain in right shoulder: Secondary | ICD-10-CM

## 2014-06-25 DIAGNOSIS — M6281 Muscle weakness (generalized): Secondary | ICD-10-CM

## 2014-06-25 DIAGNOSIS — M2550 Pain in unspecified joint: Secondary | ICD-10-CM

## 2014-06-25 DIAGNOSIS — M79671 Pain in right foot: Secondary | ICD-10-CM

## 2014-06-25 DIAGNOSIS — S43004A Unspecified dislocation of right shoulder joint, initial encounter: Secondary | ICD-10-CM

## 2014-06-25 DIAGNOSIS — M2560 Stiffness of unspecified joint, not elsewhere classified: Secondary | ICD-10-CM

## 2014-06-25 DIAGNOSIS — S92011A Displaced fracture of body of right calcaneus, initial encounter for closed fracture: Secondary | ICD-10-CM

## 2014-06-25 DIAGNOSIS — R600 Localized edema: Secondary | ICD-10-CM

## 2014-06-27 ENCOUNTER — Encounter (INDEPENDENT_AMBULATORY_CARE_PROVIDER_SITE_OTHER): Payer: BLUE CROSS/BLUE SHIELD | Admitting: Physical Therapy

## 2014-06-27 DIAGNOSIS — S43004A Unspecified dislocation of right shoulder joint, initial encounter: Secondary | ICD-10-CM

## 2014-06-27 DIAGNOSIS — M2550 Pain in unspecified joint: Secondary | ICD-10-CM

## 2014-06-27 DIAGNOSIS — R600 Localized edema: Secondary | ICD-10-CM

## 2014-06-27 DIAGNOSIS — M25511 Pain in right shoulder: Secondary | ICD-10-CM

## 2014-06-27 DIAGNOSIS — M79671 Pain in right foot: Secondary | ICD-10-CM

## 2014-06-27 DIAGNOSIS — M2560 Stiffness of unspecified joint, not elsewhere classified: Secondary | ICD-10-CM

## 2014-06-27 DIAGNOSIS — S92011A Displaced fracture of body of right calcaneus, initial encounter for closed fracture: Secondary | ICD-10-CM

## 2014-06-27 DIAGNOSIS — S92061D Displaced intraarticular fracture of right calcaneus, subsequent encounter for fracture with routine healing: Secondary | ICD-10-CM

## 2014-06-28 ENCOUNTER — Encounter: Payer: BLUE CROSS/BLUE SHIELD | Admitting: Physical Therapy

## 2014-07-02 ENCOUNTER — Encounter (INDEPENDENT_AMBULATORY_CARE_PROVIDER_SITE_OTHER): Payer: BLUE CROSS/BLUE SHIELD | Admitting: Physical Therapy

## 2014-07-02 DIAGNOSIS — M2550 Pain in unspecified joint: Secondary | ICD-10-CM

## 2014-07-02 DIAGNOSIS — M79671 Pain in right foot: Secondary | ICD-10-CM

## 2014-07-02 DIAGNOSIS — M25511 Pain in right shoulder: Secondary | ICD-10-CM

## 2014-07-02 DIAGNOSIS — S92061D Displaced intraarticular fracture of right calcaneus, subsequent encounter for fracture with routine healing: Secondary | ICD-10-CM

## 2014-07-02 DIAGNOSIS — S43004A Unspecified dislocation of right shoulder joint, initial encounter: Secondary | ICD-10-CM

## 2014-07-02 DIAGNOSIS — R600 Localized edema: Secondary | ICD-10-CM

## 2014-07-02 DIAGNOSIS — M6281 Muscle weakness (generalized): Secondary | ICD-10-CM

## 2014-07-02 DIAGNOSIS — S92011A Displaced fracture of body of right calcaneus, initial encounter for closed fracture: Secondary | ICD-10-CM

## 2014-07-04 ENCOUNTER — Encounter (INDEPENDENT_AMBULATORY_CARE_PROVIDER_SITE_OTHER): Payer: Self-pay

## 2014-07-04 ENCOUNTER — Encounter (INDEPENDENT_AMBULATORY_CARE_PROVIDER_SITE_OTHER): Payer: BLUE CROSS/BLUE SHIELD | Admitting: Physical Therapy

## 2014-07-04 DIAGNOSIS — S92061D Displaced intraarticular fracture of right calcaneus, subsequent encounter for fracture with routine healing: Secondary | ICD-10-CM

## 2014-07-04 DIAGNOSIS — S92011A Displaced fracture of body of right calcaneus, initial encounter for closed fracture: Secondary | ICD-10-CM

## 2014-07-04 DIAGNOSIS — M6281 Muscle weakness (generalized): Secondary | ICD-10-CM

## 2014-07-04 DIAGNOSIS — R600 Localized edema: Secondary | ICD-10-CM

## 2014-07-04 DIAGNOSIS — S43004A Unspecified dislocation of right shoulder joint, initial encounter: Secondary | ICD-10-CM

## 2014-07-04 DIAGNOSIS — M2550 Pain in unspecified joint: Secondary | ICD-10-CM

## 2014-07-04 DIAGNOSIS — M2560 Stiffness of unspecified joint, not elsewhere classified: Secondary | ICD-10-CM

## 2014-07-04 DIAGNOSIS — M25511 Pain in right shoulder: Secondary | ICD-10-CM

## 2014-07-04 DIAGNOSIS — M79671 Pain in right foot: Secondary | ICD-10-CM

## 2014-07-09 ENCOUNTER — Encounter: Payer: BLUE CROSS/BLUE SHIELD | Admitting: Physical Therapy

## 2014-07-11 ENCOUNTER — Ambulatory Visit (INDEPENDENT_AMBULATORY_CARE_PROVIDER_SITE_OTHER): Payer: BLUE CROSS/BLUE SHIELD | Admitting: Physical Therapy

## 2014-07-11 ENCOUNTER — Encounter: Payer: Self-pay | Admitting: Physical Therapy

## 2014-07-11 DIAGNOSIS — M2550 Pain in unspecified joint: Secondary | ICD-10-CM

## 2014-07-11 DIAGNOSIS — R600 Localized edema: Secondary | ICD-10-CM

## 2014-07-11 DIAGNOSIS — M6281 Muscle weakness (generalized): Secondary | ICD-10-CM

## 2014-07-11 DIAGNOSIS — M2560 Stiffness of unspecified joint, not elsewhere classified: Secondary | ICD-10-CM

## 2014-07-11 NOTE — Therapy (Signed)
[place], [place], [address] Phone: [phone]   Fax:  [phone]  Physical Therapy Treatment  Patient Details  Name: Sennie Borden MRN: [id number] Date of Birth: Apr 29, 1961 Referring Provider:  Newt Minion, MD  Encounter Date: 07/11/2014      PT End of Session - 07/11/14 0936    Visit Number 10   Number of Visits 17   Date for PT Re-Evaluation 07/24/14   PT Start Time 0933   PT Stop Time 3614   PT Time Calculation (min) 61 min      Past Medical History  Diagnosis Date  . Thyroid disease   . Hypotension     Past Surgical History  Procedure Laterality Date  . Abdominal hysterectomy    . Arthroscopic repair acl    . Orif calcaneous fracture Right 05/11/2014    Procedure: OPEN REDUCTION INTERNAL FIXATION (ORIF) CALCANEOUS FRACTURE;  Surgeon: Newt Minion, MD;  Location: [facility];  Service: Orthopedics;  Laterality: Right;    There were no vitals taken for this visit.  Visit Diagnosis:  Generalized muscle weakness  Stiffness of joints, multiple sites  Pain in joint, multiple sites  Localized edema      Subjective Assessment - 07/11/14 0938    Symptoms Feeling better than last visit. Walking and flexibility in ankle has improved.    Limitations Walking;Standing   Currently in Pain? Yes   Pain Score 4   drops to 3/10 when warmed up   Pain Location Heel   Pain Orientation Right   Pain Descriptors / Indicators Sore   Aggravating Factors  weight bearing    Pain Relieving Factors ice and rest    Multiple Pain Sites Yes   Pain Score 3  only when reaching    Pain Location Shoulder   Pain Orientation Right   Pain Descriptors / Indicators Sharp   Pain Frequency Occasional          [facility] PT Assessment - 07/11/14 0001    AROM   Right Ankle Dorsiflexion 0   Right Ankle Plantar Flexion 32   Right Ankle Inversion 10   Right Ankle Eversion 12   Strength   Right Shoulder  Flexion --  5-/5   Right Shoulder Extension 5/5   Right Shoulder ABduction --  5-/5, with pain   Right Shoulder Internal Rotation 5/5   Right Shoulder External Rotation --  5-/5, with pain   Right Ankle Dorsiflexion 5/5   Right Ankle Plantar Flexion 4+/5  unable to complete single leg heel raise through full range   Right Ankle Inversion 5/5   Right Ankle Eversion 5/5                  [facility] Adult PT Treatment/Exercise - 07/11/14 0001    Static Standing Balance   Static Standing - Comment/# of Minutes --  15 sec on L, 5-10 sec on R.    Single Leg Stance - Right Leg --  on blue foam; multiple trials   Single Leg Stance - Left Leg --  on blue foam; multiple trials   Shoulder Exercises: Seated   Row 10 reps;Both  2 sets    Theraband Level (Shoulder Row) Level 3 (Green)   External Rotation Both;10 reps  2 sets   Theraband Level (Shoulder External Rotation) Level 3 (Green)   Other Seated Exercises shoulder diagonals with green band 2 x 10 each direction    Cryotherapy  Number Minutes Cryotherapy 15 Minutes   Cryotherapy Location Ankle  right    Type of Cryotherapy --  VASO    Ankle Exercises: Stretches   Soleus Stretch 30 seconds;3 reps   Gastroc Stretch 30 seconds;3 reps   Ankle Exercises: Standing   BAPS Level 1;Standing;10 reps  PF/DF, inv/eversion, CW, CCW   Heel Raises 10 reps  3 sets (1 set with toes out). Bilaterally.  Heels off edge of step.           PT Education - 07/11/14 1141    Education provided Yes   Education Details HEP-changed to green band for UE exercise, included shoulder rows. Changed to blue band for DF and red band for Inv/eversion for R ankle.    Person(s) Educated Patient   Methods Explanation;Demonstration   Comprehension Verbalized understanding          PT Short Term Goals - 07/11/14 1143    PT SHORT TERM GOAL #1   Title Independent with initial HEP    Status Achieved  06/05/14   PT SHORT TERM GOAL #2   Title Increase  ROM Rt ankle to [facility]   Time -15   Period Days   Status On-going   PT SHORT TERM GOAL #3   Title Increase ROM Rt shoulder to [facility]   Status Achieved  06/19/14   PT SHORT TERM GOAL #4   Title Assess strength    Status Achieved  07/11/14   PT SHORT TERM GOAL #5   Title Progress to weight bearing when cleared by MD.    Status Achieved  06/19/14           PT Long Term Goals - 07/11/14 1145    PT LONG TERM GOAL #1   Title Demonstrate and/or verbalize techniques to reduce the risk of re-injury to include information on physical activity.    Time 13   Period Days   Status Achieved  07/11/14   PT LONG TERM GOAL #2   Title Be independent with advanced HEP    Time 13   Period Days   Status On-going   PT LONG TERM GOAL #3   Title Report pain decrease with ROM exercises   Time 13   Period Days   Status On-going   PT LONG TERM GOAL #4   Title Increase strength in Rt shoulder to =/>4+/5   Status Achieved  07/11/14   PT LONG TERM GOAL #5   Title Improve FOTO to =/<49% limited    Time 13   Period Days   Status On-going   Additional Long Term Goals   Additional Long Term Goals Yes   PT LONG TERM GOAL #6   Title Increase strength Rt ankle to =/>4+/5    Time 13   Period Days   Status Partially Met   PT LONG TERM GOAL #7   Title Ambulate on even/uneven surface without an assistive device.    Time 13   Period Days   Status On-going           Plan - 07/11/14 1242    Clinical Impression Statement Pt is making good progress towards all goals. Pt has met STG #4 and LTG #4 this session.  Pt has demonstrated increased Rt shoulder and ankle strength, as well as increased Rt ankle ROM since last assessment. Pt has tolerated new exercises and increased resistance without increase in symptoms.    PT Frequency 2x / week   PT Duration 8 weeks  PT Next Visit Plan Continue per plan of care. Progress HEP for Rt shoulder and ankle.  Next visit gait training with and without assistive device  as tolerated.    Consulted and Agree with Plan of Care Patient        Problem List Patient Active Problem List   Diagnosis Date Noted  . Calcaneal fracture 05/11/2014    Jennifer Carlson-Long, PTA 07/11/2014 12:48 PM  [facility] [address] [place], [place], [address] Phone: [phone]   Fax:  [phone]      

## 2014-07-18 ENCOUNTER — Ambulatory Visit (INDEPENDENT_AMBULATORY_CARE_PROVIDER_SITE_OTHER): Payer: BLUE CROSS/BLUE SHIELD | Admitting: Physical Therapy

## 2014-07-18 DIAGNOSIS — M2550 Pain in unspecified joint: Secondary | ICD-10-CM | POA: Diagnosis not present

## 2014-07-18 DIAGNOSIS — M2560 Stiffness of unspecified joint, not elsewhere classified: Secondary | ICD-10-CM

## 2014-07-18 DIAGNOSIS — M6281 Muscle weakness (generalized): Secondary | ICD-10-CM

## 2014-07-18 DIAGNOSIS — R600 Localized edema: Secondary | ICD-10-CM | POA: Diagnosis not present

## 2014-07-18 NOTE — Therapy (Signed)
[place], [place], [address] Phone: [phone]   Fax:  [phone]  Physical Therapy Treatment  Patient Details  Name: Carol Jacobs MRN: [id number] Date of Birth: 12-08-1960 Referring Provider:  Newt Minion, MD  Encounter Date: 07/18/2014      PT End of Session - 07/18/14 0938    Visit Number 11   Number of Visits 17   Date for PT Re-Evaluation 07/24/14   PT Start Time 0931   PT Stop Time 1035   PT Time Calculation (min) 64 min   Activity Tolerance Patient tolerated treatment well      Past Medical History  Diagnosis Date  . Thyroid disease   . Hypotension     Past Surgical History  Procedure Laterality Date  . Abdominal hysterectomy    . Arthroscopic repair acl    . Orif calcaneous fracture Right 05/11/2014    Procedure: OPEN REDUCTION INTERNAL FIXATION (ORIF) CALCANEOUS FRACTURE;  Surgeon: Newt Minion, MD;  Location: [facility];  Service: Orthopedics;  Laterality: Right;    There were no vitals taken for this visit.  Visit Diagnosis:  Generalized muscle weakness  Stiffness of joints, multiple sites  Pain in joint, multiple sites  Localized edema      Subjective Assessment - 07/18/14 0958    Symptoms Been walking some without any crutch in house. Frustrated with how sore ankle is. My ankle feels the best during and just after therapy.  At end of day, very sore and increased swelling. Pt curious how much she should "push it" outside therapy sessions.    Currently in Pain? Yes   Pain Score 2    Pain Location Ankle   Pain Orientation Right   Pain Descriptors / Indicators Sore   Multiple Pain Sites Yes   Pain Score 2  R knee - only when exercising    Pain Location Knee   Pain Orientation Right   Pain Descriptors / Indicators Sore          [facility] PT Assessment - 07/18/14 0001    AROM   Right/Left Ankle Left   Right Ankle Dorsiflexion 5   Right Ankle Plantar Flexion  36   Right Ankle Inversion 7   Right Ankle Eversion 8           [facility] Adult PT Treatment/Exercise - 07/18/14 0001    Ambulation/Gait   Gait Comments On treadmill; ~ 8 min @ 1.25mh, VC for R heel strike, even step length, increased R hip flex during swing through, even stance time. Improved post cues.   only occasional UE support for balance.    Exercises   Exercises Shoulder;Ankle   Shoulder Exercises: Supine   Flexion Strengthening;Both;15 reps   Shoulder Flexion Weight (lbs) --  both hands on 4# weight,during side stepping    Shoulder Exercises: Prone   Other Prone Exercises modified plank (hands on mat)-side stepping hands and feet. x 7 ft x 4 reps   Shoulder Exercises: Body Blade   Flexion 30 seconds;2 reps   ABduction 30 seconds;2 reps   Internal Rotation 30 seconds;1 rep   Modalities   Modalities Cryotherapy   Cryotherapy   Number Minutes Cryotherapy 15 Minutes   Cryotherapy Location Ankle  right    Type of Cryotherapy --  vaso, 3 snowflakes, high pressure   Ankle Exercises: Stretches   Soleus Stretch 4 reps;30 seconds   Gastroc Stretch 4 reps;30 seconds   Ankle Exercises: Aerobic  Stationary Bike Level 5 - 6 min    Ankle Exercises: Standing   SLS with forward leans x 15 reps each leg; only leaning over to touch seat of chair   Heel Raises 20 reps   Heel Raises Limitations --  raises 4" with L ankle and only ~3" on R ankle.    Heel Walk (Round Trip) 5 ft R/L x 4 with UE support    Toe Walk (Round Trip) 5 ft R/L x 4 with UE support    Other Standing Ankle Exercises side step 20 ft without resistance, then with red band at mid-calf    Other Standing Ankle Exercises SLS without UE support x 10 sec each leg x 3 (tremulous knees)  (difficult) Sit to stands: eccentric decent (4 sec) x 12    Ankle Exercises: Seated   ABC's 2 reps                  PT Short Term Goals - 07/11/14 1143    PT SHORT TERM GOAL #1   Title Independent with initial HEP     Status Achieved  06/05/14   PT SHORT TERM GOAL #2   Title Increase ROM Rt ankle to [facility]   Time -15   Period Days   Status On-going   PT SHORT TERM GOAL #3   Title Increase ROM Rt shoulder to [facility]   Status Achieved  06/19/14   PT SHORT TERM GOAL #4   Title Assess strength    Status Achieved  07/11/14   PT SHORT TERM GOAL #5   Title Progress to weight bearing when cleared by MD.    Status Achieved  06/19/14           PT Long Term Goals - 07/18/14 1211    PT LONG TERM GOAL #1   Title Demonstrate and/or verbalize techniques to reduce the risk of re-injury to include information on physical activity.    Time 8   Period Weeks   Status Achieved   PT LONG TERM GOAL #2   Title Be independent with advanced HEP    Time 8   Period Weeks   Status On-going   PT LONG TERM GOAL #3   Title Report pain decrease with ROM exercises   Time 8   Status Achieved   PT LONG TERM GOAL #4   Title Increase strength in Rt shoulder to =/>4+/5   Time 8   Period Weeks   Status Achieved   PT LONG TERM GOAL #5   Title Improve FOTO to =/<49% limited    Time 8   Period Weeks   Status On-going  FOTO score 07/18/14 was 58%. Intake: 84%.   PT LONG TERM GOAL #6   Title Increase strength Rt ankle to =/>4+/5    Time 8   Period Weeks   Status Partially Met   PT LONG TERM GOAL #7   Title Ambulate on even/uneven surface without an assistive device.    Time 8   Period Weeks   Status On-going               Plan - 07/18/14 1214    Clinical Impression Statement Pt continues to make good progress, reporting overall decreased pain in both Rt shoulder and Rt ankle. Pt demonstrated improved Rt DF/PF this visit. Pt tolerated new exercises without increase in symptoms. Pt demonstrated improved gait quality with minimal VC. Has met LTG #3.    PT Frequency 2x / week  PT Duration 8 weeks   PT Treatment/Interventions Cryotherapy;Electrical Stimulation;Ultrasound;Moist Heat;Gait training;Therapeutic  exercise;Patient/family education;Manual techniques   PT Next Visit Plan Plan of care ending soon(07/24/14); next visit reassess goals and progress. Next visit stair training without AD,  trial gait on uneven surfaces with and without AD as tolerated.  Progress HEP.    Consulted and Agree with Plan of Care Patient        Problem List Patient Active Problem List   Diagnosis Date Noted  . Calcaneal fracture 05/11/2014    Kerin Perna, PTA 07/18/2014 12:23 PM   [place], [place], [address] Phone: [phone]   Fax:  [phone]

## 2014-07-18 NOTE — Patient Instructions (Addendum)
*   Heel raises - toes in / out/ straight.  X 10-15 of each. 1x/day   Trunk Flexion   Standing on one leg, bend forward from hips. Touch floor, then return, keeping back straight and leg bent. Hold each position __0__ seconds. Repeat on other leg. Do __10__ repetitions, __1_ sets.  http://bt.exer.us/50   Copyright  VHI. All rights reserved.   *modified plank with hand and feet walking (small steps) x 10 ft both ways x 2 reps  * Toe walking / heel walking with hands on counter (until good form and no pain)   The patient is advised to apply ice or cold packs intermittently as needed to relieve pain. X 10-15 min with foot elevated.

## 2014-07-20 ENCOUNTER — Encounter: Payer: BLUE CROSS/BLUE SHIELD | Admitting: Physical Therapy

## 2014-07-24 ENCOUNTER — Ambulatory Visit (INDEPENDENT_AMBULATORY_CARE_PROVIDER_SITE_OTHER): Payer: BLUE CROSS/BLUE SHIELD | Admitting: Physical Therapy

## 2014-07-24 DIAGNOSIS — M2560 Stiffness of unspecified joint, not elsewhere classified: Secondary | ICD-10-CM

## 2014-07-24 DIAGNOSIS — M6281 Muscle weakness (generalized): Secondary | ICD-10-CM | POA: Diagnosis not present

## 2014-07-24 DIAGNOSIS — M2550 Pain in unspecified joint: Secondary | ICD-10-CM

## 2014-07-24 DIAGNOSIS — R600 Localized edema: Secondary | ICD-10-CM

## 2014-07-24 NOTE — Patient Instructions (Signed)
Plank: high to low, leading with L shoulder then R shoulder.  X 5 reps, x 2 sets.  Every other day.   Lt heel taps 3" step.  X 10 reps x 2 sets.  Watch to make sure R hip is not dropping.   Continue scar massage in all directions.   Try ice massage to areas of swelling in ankle.  3-5 min in circles (until numb)  Try building up time of walking without use of crutch over length of day.

## 2014-07-24 NOTE — Therapy (Signed)
[place], [place], [address] Phone: [phone]   Fax:  [phone]  Physical Therapy Treatment  Patient Details  Name: Carol Jacobs MRN: [id number] Date of Birth: March 24, 1961 Referring Provider:  Newt Minion, MD  Encounter Date: 07/24/2014      PT End of Session - 07/24/14 1024    Visit Number 12   Number of Visits 17   Date for PT Re-Evaluation 07/24/14   PT Start Time 1019   PT Stop Time 1125   PT Time Calculation (min) 66 min   Activity Tolerance Patient tolerated treatment well      Past Medical History  Diagnosis Date  . Thyroid disease   . Hypotension     Past Surgical History  Procedure Laterality Date  . Abdominal hysterectomy    . Arthroscopic repair acl    . Orif calcaneous fracture Right 05/11/2014    Procedure: OPEN REDUCTION INTERNAL FIXATION (ORIF) CALCANEOUS FRACTURE;  Surgeon: Newt Minion, MD;  Location: [facility];  Service: Orthopedics;  Laterality: Right;    There were no vitals taken for this visit.  Visit Diagnosis:  Generalized muscle weakness  Stiffness of joints, multiple sites  Pain in joint, multiple sites  Localized edema      Subjective Assessment - 07/24/14 1022    Symptoms Been walking without crutch a few hours each day at home. Biggest challenge is swelling at end of day, painful then .    Currently in Pain? No/denies          [facility] PT Assessment - 07/24/14 0001    Assessment   Medical Diagnosis Rt calcaneal fx, Rt shoulder dislocation   AROM   Right/Left Ankle Right   Right Ankle Dorsiflexion 5   Right Ankle Plantar Flexion 40   Right Ankle Inversion 18   Right Ankle Eversion 15                  [facility] Adult PT Treatment/Exercise - 07/24/14 0001    Ambulation/Gait   Ambulation/Gait Yes   Ambulation/Gait Assistance 7: Independent   Ambulation Distance (Feet) --  >200 ft    Assistive device None   Gait Pattern  Step-through pattern;Decreased stance time - right   Ambulation Surface Unlevel;Outdoor;Paved;Gravel;Grass;Other (comment)  3 small hills ascend/descend 4 ft    Exercises   Exercises Shoulder;Ankle   Shoulder Exercises: Prone   Other Prone Exercises Plank: low to high plank, L and R arm leading x 5 each x 3 sets.    Shoulder Exercises: Body Blade   Flexion 30 seconds;2 reps   ABduction 30 seconds;2 reps   Cryotherapy   Number Minutes Cryotherapy 15 Minutes   Cryotherapy Location Ankle   Type of Cryotherapy --  vaso, 3 snowflakes, med pressure.    Ankle Exercises: Stretches   Plantar Fascia Stretch 2 reps;20 seconds   Soleus Stretch 4 reps;30 seconds   Gastroc Stretch 4 reps;30 seconds   Ankle Exercises: Aerobic   Elliptical level 3, x 4 min    Ankle Exercises: Standing   BAPS Standing;Level 4  Not bearing wt through ankle, just for ROM. FWB on LLE   BAPS Limitations pt reported "tightness and pain" lateral ankle    SLS On trampoline x 30 sec x 2 reps (one rep with horizontal head turns)    Rebounder Standing on trampoline: 2 leg bounces: alternating feet, catching air, jumping jacks, forw/back scissors x 10 each   Heel Raises 10 reps  Heel Raises Limitations --  Lt heel raises 4.5", Rt heel raises 3.5" from floor.    Heel Walk (Round Trip) 16 ft    Toe Walk (Round Trip) 16 ft    Other Standing Ankle Exercises Lt Heel taps from 3" step x 15 (6" too difficult); mirror for visual feeback.                 PT Education - 07/24/14 1118    Education Details HEP, Self care - discussed scar massage and ice massage, as well as continued weening of crutch.    Person(s) Educated Patient   Methods Explanation;Demonstration;Handout   Comprehension Verbalized understanding          PT Short Term Goals - 07/24/14 1118    PT SHORT TERM GOAL #1   Title Independent with initial HEP    Status Achieved   PT SHORT TERM GOAL #2   Title Increase ROM Rt ankle to [facility]   Status  On-going   PT SHORT TERM GOAL #3   Title Increase ROM Rt shoulder to [facility]   Status Achieved   PT SHORT TERM GOAL #4   Title Assess strength    Status Achieved   PT SHORT TERM GOAL #5   Title Progress to weight bearing when cleared by MD.    Status Achieved           PT Long Term Goals - 07/24/14 1118    PT LONG TERM GOAL #1   Title Demonstrate and/or verbalize techniques to reduce the risk of re-injury to include information on physical activity.    Time 8   Period Weeks   Status Achieved   PT LONG TERM GOAL #2   Title Be independent with advanced HEP    Time 8   Period Weeks   Status Achieved   PT LONG TERM GOAL #3   Title Report pain decrease with ROM exercises   Time 8   Period Weeks   Status Achieved   PT LONG TERM GOAL #4   Title Increase strength in Rt shoulder to =/>4+/5   Period Weeks   Status Achieved   PT LONG TERM GOAL #5   Title Improve FOTO to =/<49% limited    Time 8   Period Weeks   Status On-going  scored 58% on 07/18/14   PT LONG TERM GOAL #6   Title Increase strength Rt ankle to =/>4+/5    Time 8   Period Weeks   Status Partially Met  still not able to complete Rt heel raise full ROM.    PT LONG TERM GOAL #7   Title Ambulate on even/uneven surface without an assistive device.    Time 8   Period Weeks   Status Achieved               Plan - 07/24/14 1138    Clinical Impression Statement Pt demonstrated improved ROM of Rt ankle and improved gait quality without AD on even/uneven surface. Pt has met LTG #2 and 7. Pt tolerated all new exercises with minimal change in symptoms. Pt continues to present with decreased Rt ankle ROM and strength; may benefit from continued PT intervention to maximize functional independence.    Rehab Potential Excellent   PT Treatment/Interventions Cryotherapy;Electrical Stimulation;Ultrasound;Moist Heat;Gait training;Therapeutic exercise;Patient/family education;Manual techniques   PT Next Visit Plan Discussed  with supervising PT regarding progress and goals.    Consulted and Agree with Plan of Care Patient  Problem List Patient Active Problem List   Diagnosis Date Noted  . Calcaneal fracture 05/11/2014    Regginald Pask,SUE 07/24/2014, 12:41 PM  [facility] [place], [place], [address] Phone: [phone]   Fax:  [phone]

## 2014-07-24 NOTE — Therapy (Signed)
[place], [place], [address] Phone: [phone]   Fax:  [phone]  Physical Therapy Treatment  Patient Details  Name: Carol Jacobs MRN: [id number] Date of Birth: 1960/06/04 Referring Provider:  Newt Minion, MD  Encounter Date: 07/24/2014      PT End of Session - 07/24/14 1024    Visit Number 12   Number of Visits 17   Date for PT Re-Evaluation 07/24/14   PT Start Time 1019   PT Stop Time 1125   PT Time Calculation (min) 66 min   Activity Tolerance Patient tolerated treatment well      Past Medical History  Diagnosis Date  . Thyroid disease   . Hypotension     Past Surgical History  Procedure Laterality Date  . Abdominal hysterectomy    . Arthroscopic repair acl    . Orif calcaneous fracture Right 05/11/2014    Procedure: OPEN REDUCTION INTERNAL FIXATION (ORIF) CALCANEOUS FRACTURE;  Surgeon: Newt Minion, MD;  Location: [facility];  Service: Orthopedics;  Laterality: Right;    There were no vitals taken for this visit.  Visit Diagnosis:  Generalized muscle weakness  Stiffness of joints, multiple sites  Pain in joint, multiple sites  Localized edema      Subjective Assessment - 07/24/14 1022    Symptoms Been walking without crutch a few hours each day at home. Biggest challenge is swelling at end of day, painful then .    Currently in Pain? No/denies          [facility] PT Assessment - 07/24/14 0001    AROM   Right/Left Ankle Right   Right Ankle Dorsiflexion 5   Right Ankle Plantar Flexion 40   Right Ankle Inversion 18   Right Ankle Eversion 15                  [facility] Adult PT Treatment/Exercise - 07/24/14 0001    Ambulation/Gait   Ambulation/Gait Yes   Ambulation/Gait Assistance 7: Independent   Ambulation Distance (Feet) --  >200 ft    Assistive device None   Gait Pattern Step-through pattern;Decreased stance time - right   Ambulation Surface  Unlevel;Outdoor;Paved;Gravel;Grass;Other (comment)  3 small hills ascend/descend 4 ft    Exercises   Exercises Shoulder;Ankle   Shoulder Exercises: Prone   Other Prone Exercises Plank: low to high plank, L and R arm leading x 5 each x 3 sets.    Shoulder Exercises: Body Blade   Flexion 30 seconds;2 reps   ABduction 30 seconds;2 reps   Cryotherapy   Number Minutes Cryotherapy 15 Minutes   Cryotherapy Location Ankle   Type of Cryotherapy --  vaso, 3 snowflakes, med pressure.    Ankle Exercises: Stretches   Plantar Fascia Stretch 2 reps;20 seconds   Soleus Stretch 4 reps;30 seconds   Gastroc Stretch 4 reps;30 seconds   Ankle Exercises: Aerobic   Elliptical level 3, x 4 min    Ankle Exercises: Standing   BAPS Standing;Level 4  Not bearing wt through ankle, just for ROM. FWB on LLE   BAPS Limitations pt reported "tightness and pain" lateral ankle    SLS On trampoline x 30 sec x 2 reps (one rep with horizontal head turns)    Rebounder Standing on trampoline: 2 leg bounces: alternating feet, catching air, jumping jacks, forw/back scissors x 10 each   Heel Raises 10 reps   Heel Raises Limitations --  Lt heel raises 4.5", Rt heel  raises 3.5" from floor.    Heel Walk (Round Trip) 16 ft    Toe Walk (Round Trip) 16 ft    Other Standing Ankle Exercises Lt Heel taps from 3" step x 15 (6" too difficult); mirror for visual feeback.                 PT Education - 07/24/14 1118    Education Details HEP, Self care - discussed scar massage and ice massage, as well as continued weening of crutch.    Person(s) Educated Patient   Methods Explanation;Demonstration;Handout   Comprehension Verbalized understanding          PT Short Term Goals - 07/24/14 1118    PT SHORT TERM GOAL #1   Title Independent with initial HEP    Status Achieved   PT SHORT TERM GOAL #2   Title Increase ROM Rt ankle to [facility]   Status On-going   PT SHORT TERM GOAL #3   Title Increase ROM Rt shoulder to [facility]    Status Achieved   PT SHORT TERM GOAL #4   Title Assess strength    Status Achieved   PT SHORT TERM GOAL #5   Title Progress to weight bearing when cleared by MD.    Status Achieved           PT Long Term Goals - 07/24/14 1118    PT LONG TERM GOAL #1   Title Demonstrate and/or verbalize techniques to reduce the risk of re-injury to include information on physical activity.    Time 8   Period Weeks   Status Achieved   PT LONG TERM GOAL #2   Title Be independent with advanced HEP    Time 8   Period Weeks   Status Achieved   PT LONG TERM GOAL #3   Title Report pain decrease with ROM exercises   Time 8   Period Weeks   Status Achieved   PT LONG TERM GOAL #4   Title Increase strength in Rt shoulder to =/>4+/5   Period Weeks   Status Achieved   PT LONG TERM GOAL #5   Title Improve FOTO to =/<49% limited    Time 8   Period Weeks   Status On-going  scored 58% on 07/18/14   PT LONG TERM GOAL #6   Title Increase strength Rt ankle to =/>4+/5    Time 8   Period Weeks   Status Partially Met  still not able to complete Rt heel raise full ROM.    PT LONG TERM GOAL #7   Title Ambulate on even/uneven surface without an assistive device.    Time 8   Period Weeks   Status Achieved               Plan - 07/24/14 1138    Clinical Impression Statement Pt demonstrated improved ROM of Rt ankle and improved gait quality without AD on even/uneven surface. Pt has met LTG #2 and 7. Pt tolerated all new exercises with minimal change in symptoms. Pt continues to present with decreased Rt ankle ROM and strength; may benefit from continued PT intervention to maximize functional independence.    Rehab Potential Excellent   PT Treatment/Interventions Cryotherapy;Electrical Stimulation;Ultrasound;Moist Heat;Gait training;Therapeutic exercise;Patient/family education;Manual techniques   PT Next Visit Plan Discussed with supervising PT regarding progress and goals.    Consulted and Agree  with Plan of Care Patient        Problem List Patient Active Problem List   Diagnosis  Date Noted  . Calcaneal fracture 05/11/2014   Kerin Perna, PTA 07/24/2014 11:48 AM  [facility] [place], [place], [address] Phone: [phone]   Fax:  [phone]

## 2014-07-31 ENCOUNTER — Ambulatory Visit (INDEPENDENT_AMBULATORY_CARE_PROVIDER_SITE_OTHER): Payer: BLUE CROSS/BLUE SHIELD | Admitting: Physical Therapy

## 2014-07-31 DIAGNOSIS — M6281 Muscle weakness (generalized): Secondary | ICD-10-CM | POA: Diagnosis not present

## 2014-07-31 DIAGNOSIS — M2560 Stiffness of unspecified joint, not elsewhere classified: Secondary | ICD-10-CM

## 2014-07-31 DIAGNOSIS — M2550 Pain in unspecified joint: Secondary | ICD-10-CM | POA: Diagnosis not present

## 2014-07-31 DIAGNOSIS — R600 Localized edema: Secondary | ICD-10-CM | POA: Diagnosis not present

## 2014-07-31 NOTE — Therapy (Signed)
[place], [place], [address] Phone: [phone]   Fax:  [phone]  Physical Therapy Treatment  Patient Details  Name: Carol Jacobs MRN: [id number] Date of Birth: [birth year] Referring Provider:  Newt Minion, MD  Encounter Date: 07/31/2014      PT End of Session - 07/31/14 0936    Visit Number 13   Number of Visits 17   Date for PT Re-Evaluation 08/21/14   PT Start Time 0934   PT Stop Time 1031   PT Time Calculation (min) 57 min   Activity Tolerance Patient tolerated treatment well      Past Medical History  Diagnosis Date  . Thyroid disease   . Hypotension     Past Surgical History  Procedure Laterality Date  . Abdominal hysterectomy    . Arthroscopic repair acl    . Orif calcaneous fracture Right 05/11/2014    Procedure: OPEN REDUCTION INTERNAL FIXATION (ORIF) CALCANEOUS FRACTURE;  Surgeon: Newt Minion, MD;  Location: [facility];  Service: Orthopedics;  Laterality: Right;    There were no vitals taken for this visit.  Visit Diagnosis:  Stiffness of joints, multiple sites  Generalized muscle weakness  Pain in joint, multiple sites  Localized edema      Subjective Assessment - 07/31/14 0936    Symptoms Pt reports d/c crutch since last visit.  Pt reports slight increase in swelling in ankle and increased pain due to increased time on feet. Rt knee has felt sore and tight.    Patient Stated Goals Return to gym and lift weights, back to daily life without having to stop and rest, play tennis.    Pain Score 2    Pain Location Ankle   Pain Orientation Right   Pain Descriptors / Indicators Sore;Aching   Aggravating Factors  weight bearing    Pain Relieving Factors ice and rest.    Multiple Pain Sites No          [facility] PT Assessment - 07/31/14 0001    Assessment   Medical Diagnosis Rt calcaneal fx, Rt shoulder dislocation   AROM   Right/Left Ankle Right   Right Ankle  Dorsiflexion 6   Right Ankle Plantar Flexion 52   Right Ankle Inversion 20   Right Ankle Eversion 21                  [facility] Adult PT Treatment/Exercise - 07/31/14 0001    Exercises   Exercises Ankle   Ankle Exercises: Standing   SLS On Bosu, 30 sec x 3 reps (occasional hands to steady)    Ankle Manual therapy:  Joint mobilizations: grade 2-3 posterior Rt talus mobs, calcaneal rocking to Rt ankle.        Ankle Modalities:  Cryotherapy: vaso med pressure, 15 min to Rt ankle.    Heel Raises --  3 x10 toes in, out, straight. eccentric lower RLE x 10   Other Standing Ankle Exercises Lateral and forward step ups onto BOSU with 2 sec pause in SLS x 10 reps;  heel taps from 6" step x 20. (difficult) Standing gastroc/ soleus stretch x 30 sec x 3 reps Rt LE.    Other Standing Ankle Exercises SLS RLE forward leans with cone pick up x 15 reps (difficult)   Ankle Exercises: Seated   Ankle Circles/Pumps AROM;Right;20 reps                  PT Short Term Goals -  07/24/14 1118    PT SHORT TERM GOAL #1   Title Independent with initial HEP    Status Achieved   PT SHORT TERM GOAL #2   Title Increase ROM Rt ankle to [facility]   Status On-going   PT SHORT TERM GOAL #3   Title Increase ROM Rt shoulder to [facility]   Status Achieved   PT SHORT TERM GOAL #4   Title Assess strength    Status Achieved   PT SHORT TERM GOAL #5   Title Progress to weight bearing when cleared by MD.    Status Achieved           PT Long Term Goals - 07/24/14 1118    PT LONG TERM GOAL #1   Title Demonstrate and/or verbalize techniques to reduce the risk of re-injury to include information on physical activity.    Time 8   Period Weeks   Status Achieved   PT LONG TERM GOAL #2   Title Be independent with advanced HEP    Time 8   Period Weeks   Status Achieved   PT LONG TERM GOAL #3   Title Report pain decrease with ROM exercises   Time 8   Period Weeks   Status Achieved   PT LONG TERM GOAL #4    Title Increase strength in Rt shoulder to =/>4+/5   Period Weeks   Status Achieved   PT LONG TERM GOAL #5   Title Improve FOTO to =/<49% limited    Time 8   Period Weeks   Status On-going  scored 58% on 07/18/14   PT LONG TERM GOAL #6   Title Increase strength Rt ankle to =/>4+/5    Time 8   Period Weeks   Status Partially Met  still not able to complete Rt heel raise full ROM.    PT LONG TERM GOAL #7   Title Ambulate on even/uneven surface without an assistive device.    Time 8   Period Weeks   Status Achieved               Plan - 07/31/14 1048    Clinical Impression Statement Pt continues to make good progress towards all goals. Pt demonstrated improved Rt ankle ROM this visit.  Pt tolerated all new exercises with minimal increase in symptoms.    Rehab Potential Excellent   PT Frequency 2x / week   PT Duration 8 weeks   PT Treatment/Interventions Cryotherapy;Electrical Stimulation;Ultrasound;Moist Heat;Gait training;Therapeutic exercise;Patient/family education;Manual techniques   PT Next Visit Plan Progress HEP. Try agility drills next visit to prepare pt for return to activity at gym.    Consulted and Agree with Plan of Care Patient        Problem List Patient Active Problem List   Diagnosis Date Noted  . Calcaneal fracture 05/11/2014   Kerin Perna, PTA 07/31/2014 12:35 PM   [place], [place], [address] Phone: [phone]   Fax:  [phone]

## 2014-08-07 ENCOUNTER — Ambulatory Visit (INDEPENDENT_AMBULATORY_CARE_PROVIDER_SITE_OTHER): Payer: BLUE CROSS/BLUE SHIELD | Admitting: Physical Therapy

## 2014-08-07 DIAGNOSIS — M2560 Stiffness of unspecified joint, not elsewhere classified: Secondary | ICD-10-CM | POA: Diagnosis not present

## 2014-08-07 DIAGNOSIS — M6281 Muscle weakness (generalized): Secondary | ICD-10-CM

## 2014-08-07 DIAGNOSIS — R600 Localized edema: Secondary | ICD-10-CM

## 2014-08-07 DIAGNOSIS — M2550 Pain in unspecified joint: Secondary | ICD-10-CM

## 2014-08-07 NOTE — Therapy (Signed)
[place], [place], [address] Phone: [phone]   Fax:  [phone]  Physical Therapy Treatment  Patient Details  Name: Carol Jacobs MRN: [id number] Date of Birth: 12-01-1960 Referring Provider:  Newt Minion, MD  Encounter Date: 08/07/2014      PT End of Session - 08/07/14 1027    Visit Number 14   Number of Visits 17   Date for PT Re-Evaluation 08/21/14   PT Start Time 1017   PT Stop Time 1112   PT Time Calculation (min) 55 min      Past Medical History  Diagnosis Date  . Thyroid disease   . Hypotension     Past Surgical History  Procedure Laterality Date  . Abdominal hysterectomy    . Arthroscopic repair acl    . Orif calcaneous fracture Right 05/11/2014    Procedure: OPEN REDUCTION INTERNAL FIXATION (ORIF) CALCANEOUS FRACTURE;  Surgeon: Newt Minion, MD;  Location: [facility];  Service: Orthopedics;  Laterality: Right;    There were no vitals filed for this visit.  Visit Diagnosis:  Stiffness of joints, multiple sites  Generalized muscle weakness  Pain in joint, multiple sites  Localized edema      Subjective Assessment - 08/07/14 1020    Symptoms Pt reports Rt ankle not swelling as much, stamina and ROM feels like it is increasing.    Patient Stated Goals Return to gym and lift weights, back to daily life without having to stop and rest, play tennis.    Currently in Pain? Yes  only when walking/ ROM exercise.    Pain Score 2    Pain Location Ankle   Pain Orientation Right;Lateral;Lower   Pain Descriptors / Indicators Aching;Sharp;Sore   Pain Relieving Factors walking    Effect of Pain on Daily Activities ice and rest            [facility] PT Assessment - 08/07/14 0001    Assessment   Medical Diagnosis Rt calcaneal fx, Rt shoulder dislocation   AROM   Right/Left Ankle Right   Right Ankle Dorsiflexion 8   Right Ankle Plantar Flexion 54   Right Ankle Inversion 25    Right Ankle Eversion 22                   [facility] Adult PT Treatment/Exercise - 08/07/14 0001    Exercises   Exercises Ankle   Ankle Exercises: Aerobic   Tread Mill 8 min total. Brisk walk 2.5-3.0 (88mn). 3 trials of jogging for 30 sec.    Ankle Exercises: Stretches   Soleus Stretch 4 reps;30 seconds   Gastroc Stretch 4 reps;30 seconds   Ankle Exercises: Standing   SLS On Bosu, 30 sec x 3 reps (occasional hands to steady)    Rebounder Green ball throw wiht Rt SLS, 10 throws with foot straight, then body angled R/L.   Heel Walk (Round Trip) 8 ft with 5# wts in hands    Toe Walk (Round Trip) 8 ft with 5# weights in hands    Side Shuffle (Round Trip) 100 ft Rt to/from Lt.    Other Standing Ankle Exercises Agility drills with med speed using floor ladder, hopping two feet in/out. forward diagonal jog with cutting motions med speed.  Forward walking lunges with 5# in each hand followed by heel raise after each rep x 15.    Other Standing Ankle Exercises SLS RLE forward leans with cone pick up x 15 reps  PT Education - 08/07/14 1116    Education provided Yes   Education Details HEP- discussed walk/jog progression and to incorporate agility drills to prepare for return to tennis.    Person(s) Educated Patient   Methods Explanation   Comprehension Verbalized understanding          PT Short Term Goals - 07/24/14 1118    PT SHORT TERM GOAL #1   Title Independent with initial HEP    Status Achieved   PT SHORT TERM GOAL #2   Title Increase ROM Rt ankle to [facility]   Status On-going   PT SHORT TERM GOAL #3   Title Increase ROM Rt shoulder to [facility]   Status Achieved   PT SHORT TERM GOAL #4   Title Assess strength    Status Achieved   PT SHORT TERM GOAL #5   Title Progress to weight bearing when cleared by MD.    Status Achieved           PT Long Term Goals - 07/24/14 1118    PT LONG TERM GOAL #1   Title Demonstrate and/or verbalize techniques to  reduce the risk of re-injury to include information on physical activity.    Time 8   Period Weeks   Status Achieved   PT LONG TERM GOAL #2   Title Be independent with advanced HEP    Time 8   Period Weeks   Status Achieved   PT LONG TERM GOAL #3   Title Report pain decrease with ROM exercises   Time 8   Period Weeks   Status Achieved   PT LONG TERM GOAL #4   Title Increase strength in Rt shoulder to =/>4+/5   Period Weeks   Status Achieved   PT LONG TERM GOAL #5   Title Improve FOTO to =/<49% limited    Time 8   Period Weeks   Status On-going  scored 58% on 07/18/14   PT LONG TERM GOAL #6   Title Increase strength Rt ankle to =/>4+/5    Time 8   Period Weeks   Status Partially Met  still not able to complete Rt heel raise full ROM.    PT LONG TERM GOAL #7   Title Ambulate on even/uneven surface without an assistive device.    Time 8   Period Weeks   Status Achieved               Plan - 08/07/14 1111    Clinical Impression Statement Pt demonstrated improved Rt ankle ROM. Pt able to tolerate light jogging intervals and agility drills without increase in pain. Pt making great gains towards remaining goals.    Rehab Potential Excellent   PT Frequency 2x / week   PT Duration 8 weeks   PT Treatment/Interventions Cryotherapy;Electrical Stimulation;Ultrasound;Moist Heat;Gait training;Therapeutic exercise;Patient/family education;Manual techniques   PT Next Visit Plan Advance HEP and prepare for d/c   Consulted and Agree with Plan of Care Patient        Problem List Patient Active Problem List   Diagnosis Date Noted  . Calcaneal fracture 05/11/2014   Kerin Perna, PTA 08/07/2014 11:20 AM  [place], [place], [address] Phone: [phone]   Fax:  [phone]

## 2014-08-13 ENCOUNTER — Ambulatory Visit (INDEPENDENT_AMBULATORY_CARE_PROVIDER_SITE_OTHER): Payer: BLUE CROSS/BLUE SHIELD | Admitting: Physical Therapy

## 2014-08-13 DIAGNOSIS — M2560 Stiffness of unspecified joint, not elsewhere classified: Secondary | ICD-10-CM

## 2014-08-13 DIAGNOSIS — M2550 Pain in unspecified joint: Secondary | ICD-10-CM

## 2014-08-13 DIAGNOSIS — M6281 Muscle weakness (generalized): Secondary | ICD-10-CM

## 2014-08-13 DIAGNOSIS — R600 Localized edema: Secondary | ICD-10-CM

## 2014-08-13 NOTE — Therapy (Signed)
[place], [place], [address] Phone: [phone]   Fax:  [phone]  Physical Therapy Treatment  Patient Details  Name: Carol Jacobs MRN: [id number] Date of Birth: 1960-12-28 Referring Provider:  Newt Minion, MD  Encounter Date: 08/13/2014      PT End of Session - 08/13/14 0851    Visit Number 15   Number of Visits 17   Date for PT Re-Evaluation 08/21/14   PT Start Time 0849   PT Stop Time 0939   PT Time Calculation (min) 50 min   Activity Tolerance Patient tolerated treatment well      Past Medical History  Diagnosis Date  . Thyroid disease   . Hypotension     Past Surgical History  Procedure Laterality Date  . Abdominal hysterectomy    . Arthroscopic repair acl    . Orif calcaneous fracture Right 05/11/2014    Procedure: OPEN REDUCTION INTERNAL FIXATION (ORIF) CALCANEOUS FRACTURE;  Surgeon: Newt Minion, MD;  Location: [facility];  Service: Orthopedics;  Laterality: Right;    There were no vitals filed for this visit.  Visit Diagnosis:  Stiffness of joints, multiple sites  Generalized muscle weakness  Pain in joint, multiple sites  Localized edema      Subjective Assessment - 08/13/14 0851    Symptoms Pt reports things are getting better each day.  Pain she reports in her Rt ankle she feels is from swelling.    Patient Stated Goals Return to gym and lift weights, back to daily life without having to stop and rest, play tennis.    Pain Score 4    Pain Location Ankle   Pain Orientation Right;Lateral;Lower   Pain Descriptors / Indicators Sharp;Aching   Aggravating Factors  overuse with walking and carrying bags    Pain Relieving Factors ice and rest            [facility] PT Assessment - 08/13/14 0001    Assessment   Medical Diagnosis Rt calcaneal fx, Rt shoulder dislocation                   [facility] Adult PT Treatment/Exercise - 08/13/14 0001    Exercises   Exercises Ankle;Shoulder   Shoulder Exercises: ROM/Strengthening   Plank --  with hands on fitter,1black band 3 trials.    Side Plank 3 reps  12 sec   Modalities   Modalities Cryotherapy   Cryotherapy   Number Minutes Cryotherapy 15 Minutes   Cryotherapy Location Ankle   Type of Cryotherapy --  Vaso:3 snowflakes, med pressure.    Ankle Exercises: Stretches   Soleus Stretch 30 seconds;4 reps   Gastroc Stretch 4 reps;30 seconds   Ankle Exercises: Aerobic   Tread Mill 8 min total. 4 min brisk walk at 3.0, jog at 5.0  30 sec x 2, jog at 5.0 x 1 min difficult.    Ankle Exercises: Standing   SLS On Bosu, 30 sec x 1; Upside down bosu 10 sec x 4 reps.    Heel Raises 10 reps  2 sets, RLE only   Heel Raises Limitations Heels raise evenly now.    Side Shuffle (Round Trip) 100 ft Rt to/from Lt.    Other Standing Ankle Exercises Hop and stick with 3 sec hold in stance   Other Standing Ankle Exercises Agility drills for zig zag running with cutting. (improved from last week)  PT Short Term Goals - 07/24/14 1118    PT SHORT TERM GOAL #1   Title Independent with initial HEP    Status Achieved   PT SHORT TERM GOAL #2   Title Increase ROM Rt ankle to [facility]   Status On-going   PT SHORT TERM GOAL #3   Title Increase ROM Rt shoulder to [facility]   Status Achieved   PT SHORT TERM GOAL #4   Title Assess strength    Status Achieved   PT SHORT TERM GOAL #5   Title Progress to weight bearing when cleared by MD.    Status Achieved           PT Long Term Goals - 08/13/14 1324    PT LONG TERM GOAL #1   Title Demonstrate and/or verbalize techniques to reduce the risk of re-injury to include information on physical activity.    Time 8   Period Weeks   Status Achieved   PT LONG TERM GOAL #2   Title Be independent with advanced HEP    Time 8   Period Weeks   Status Achieved   PT LONG TERM GOAL #3   Title Report pain decrease with ROM exercises   Time 8   Period Weeks    Status Achieved   PT LONG TERM GOAL #4   Title Increase strength in Rt shoulder to =/>4+/5   Time 8   Period Weeks   Status Achieved   PT LONG TERM GOAL #5   Title Improve FOTO to =/<49% limited    Time 8   Period Weeks   Status On-going   PT LONG TERM GOAL #6   Title Increase strength Rt ankle to =/>4+/5    Time 8   Period Weeks   Status Achieved   PT LONG TERM GOAL #7   Title Ambulate on even/uneven surface without an assistive device.    Time 8   Period Weeks   Status Achieved               Plan - 08/13/14 1322    Clinical Impression Statement Pt able to perform agility drills and light jogging (with increased speed) without increase in pain and greater ease; less guarding as well. Pt tolerated all new exercises well. Pt has met LTG #6 today.    Rehab Potential Excellent   PT Frequency 2x / week   PT Duration 8 weeks   PT Treatment/Interventions Cryotherapy;Electrical Stimulation;Ultrasound;Moist Heat;Gait training;Therapeutic exercise;Patient/family education;Manual techniques   PT Next Visit Plan Advance HEP and prepare for d/c   Consulted and Agree with Plan of Care Patient        Problem List Patient Active Problem List   Diagnosis Date Noted  . Calcaneal fracture 05/11/2014    Kerin Perna, PTA 08/13/2014 1:27 PM  [facility] [place], [place], [address] Phone: [phone]   Fax:  [phone]

## 2014-08-21 ENCOUNTER — Ambulatory Visit (INDEPENDENT_AMBULATORY_CARE_PROVIDER_SITE_OTHER): Payer: BLUE CROSS/BLUE SHIELD | Admitting: Physical Therapy

## 2014-08-21 ENCOUNTER — Encounter: Payer: Self-pay | Admitting: Physical Therapy

## 2014-08-21 DIAGNOSIS — R600 Localized edema: Secondary | ICD-10-CM

## 2014-08-21 DIAGNOSIS — M6281 Muscle weakness (generalized): Secondary | ICD-10-CM

## 2014-08-21 DIAGNOSIS — M2550 Pain in unspecified joint: Secondary | ICD-10-CM

## 2014-08-21 NOTE — Therapy (Signed)
[facility], [place], [address] Phone: [phone]   Fax:  [phone]  Physical Therapy Treatment  Patient Details  Name: Carol Jacobs MRN: [id number] Date of Birth: May 13, 1961 Referring Provider:  Newt Minion, MD  Encounter Date: 08/21/2014      PT End of Session - 08/21/14 0935    Visit Number 16   Number of Visits 17   Date for PT Re-Evaluation 08/21/14   PT Start Time 0935   PT Stop Time 1749   PT Time Calculation (min) 57 min      Past Medical History  Diagnosis Date  . Thyroid disease   . Hypotension     Past Surgical History  Procedure Laterality Date  . Abdominal hysterectomy    . Arthroscopic repair acl    . Orif calcaneous fracture Right 05/11/2014    Procedure: OPEN REDUCTION INTERNAL FIXATION (ORIF) CALCANEOUS FRACTURE;  Surgeon: Newt Minion, MD;  Location: [facility];  Service: Orthopedics;  Laterality: Right;    There were no vitals filed for this visit.  Visit Diagnosis:  Generalized muscle weakness  Pain in joint, multiple sites  Localized edema          [facility] PT Assessment - 08/21/14 0001    Assessment   Medical Diagnosis Rt calcaneal fx, Rt shoulder dislocation   Observation/Other Assessments   Focus on Therapeutic Outcomes (FOTO)  50% limited   AROM   Right/Left Ankle Right   Right Ankle Dorsiflexion 8   Right Ankle Plantar Flexion 54   Right Ankle Inversion 25   Right Ankle Eversion 22   PROM   PROM Assessment Site Ankle   Right/Left Ankle Right  dorsiflexion 12    Strength   Right Shoulder Flexion --  Rt shoulder 5/5, mid and low traps 5-/5   Right Ankle Dorsiflexion 5/5   Right Ankle Plantar Flexion --  5-/5   Right Ankle Inversion 5/5   Right Ankle Eversion 5/5   Standardized Balance Assessment   Standardized Balance Assessment --  single leg stance Rt LE 12 sec                   [facility] Adult PT Treatment/Exercise - 08/21/14  0001    Exercises   Exercises Knee/Hip;Ankle   Knee/Hip Exercises: Aerobic   Elliptical L3x5'   Knee/Hip Exercises: Standing   SLS with fwd leans without shoe   Knee/Hip Exercises: Supine   Other Supine Knee Exercises marble toe grabs then over pressure stretching toes into flexion.    Cryotherapy   Number Minutes Cryotherapy 15 Minutes   Cryotherapy Location Ankle  Rt   Type of Cryotherapy --  vaso, high pressure, 3*   Ankle Exercises: Stretches   Soleus Stretch 30 seconds   Gastroc Stretch 30 seconds                  PT Short Term Goals - 08/21/14 0954    PT SHORT TERM GOAL #1   Title Independent with initial HEP    Status Achieved   PT SHORT TERM GOAL #2   Title Increase ROM Rt ankle to [facility]   Status Achieved   PT SHORT TERM GOAL #3   Title Increase ROM Rt shoulder to [facility]   Status Achieved   PT SHORT TERM GOAL #4   Title Assess strength    Status Achieved   PT SHORT TERM GOAL #5   Title Progress to weight bearing  when cleared by MD.    Status Achieved           PT Long Term Goals - 08/21/14 0955    PT LONG TERM GOAL #1   Title Demonstrate and/or verbalize techniques to reduce the risk of re-injury to include information on physical activity.    Status Achieved   PT LONG TERM GOAL #2   Title Be independent with advanced HEP    Status Achieved   PT LONG TERM GOAL #3   Title Report pain decrease with ROM exercises   Status Achieved   PT LONG TERM GOAL #4   Title Increase strength in Rt shoulder to =/>4+/5   Status Achieved   PT LONG TERM GOAL #5   Title Improve FOTO to =/<49% limited   scored 50% limited, one point off of goal   Status Not Met   PT LONG TERM GOAL #6   Title Increase strength Rt ankle to =/>4+/5    Status Achieved   PT LONG TERM GOAL #7   Title Ambulate on even/uneven surface without an assistive device.    Status Achieved               Problem List Patient Active Problem List   Diagnosis Date Noted  . Calcaneal  fracture 05/11/2014    Jeral Pinch, PT 08/21/2014, 10:20 AM  [facility] [place], [place], [address] Phone: [phone]   Fax:  [phone]   PHYSICAL THERAPY DISCHARGE SUMMARY  Visits from Start of Care: *16  Current functional level related to goals / functional outcomes: *as above   Remaining deficits: *swelling and pain with long periods of time on her foot   Education / Equipment: *HEP Plan: Patient agrees to discharge.  Patient goals were partially met. Patient is being discharged due to being pleased with the current functional level.  ?????    Jeral Pinch, PT

## 2015-12-18 IMAGING — CT CT ANKLE*R* W/O CM
3 of 5 series · 7 of 20 positions shown, 8 images · non-contrast
Comparison: Plain films of the right ankle 05/08/2014.

CLINICAL DATA: Status post fall from a ladder 05/08/2014 with a
calcaneal fracture.

EXAM:
CT OF THE RIGHT ANKLE WITHOUT CONTRAST
TECHNIQUE: Multidetector CT imaging of the right ankle was performed according
to the standard protocol. Multiplanar CT image reconstructions were
also generated.

[Series 103: cor soft · coronal · 0.39mm/px · 3 of 83 slices shown]
[im 17/83  bone]
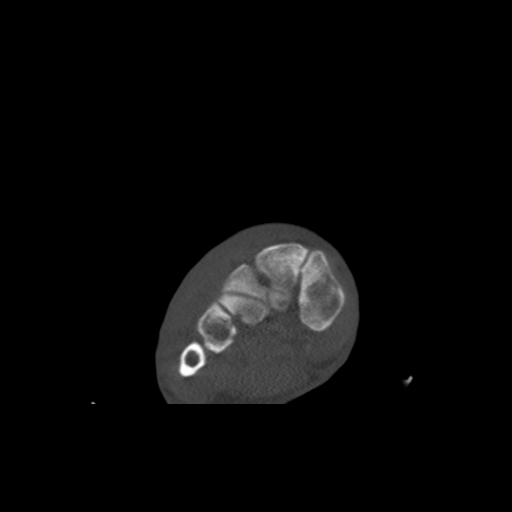
[im 33/83  bone]
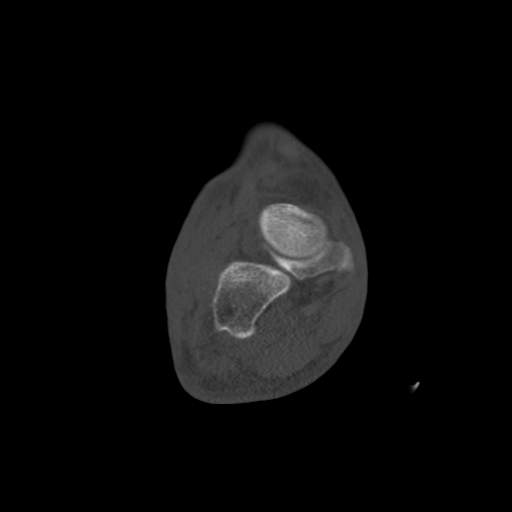
[im 50/83  bone]
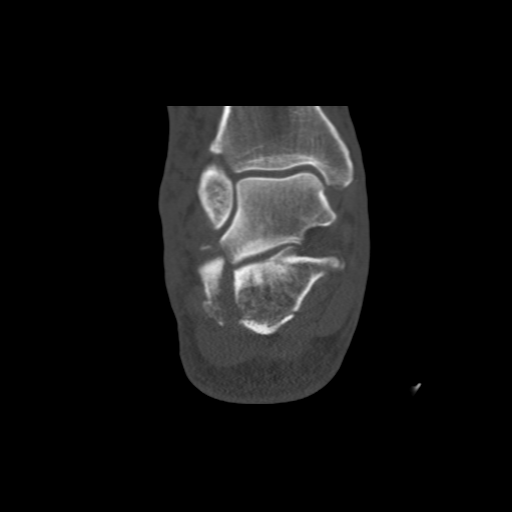

[Series 104: sag soft · sagittal · 0.39mm/px · 2 of 49 slices shown]
[im 17/49  soft-tissue]
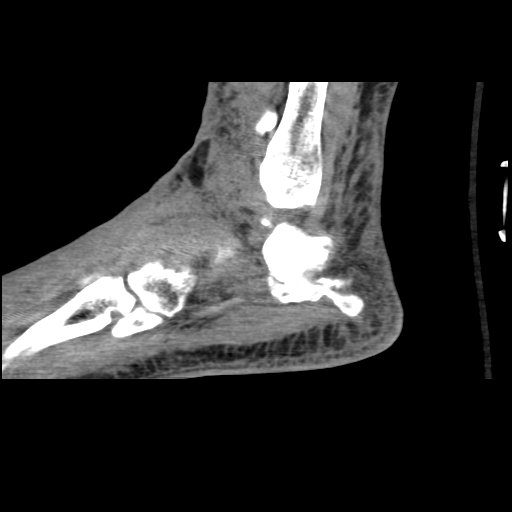
[im 33/49  soft-tissue]
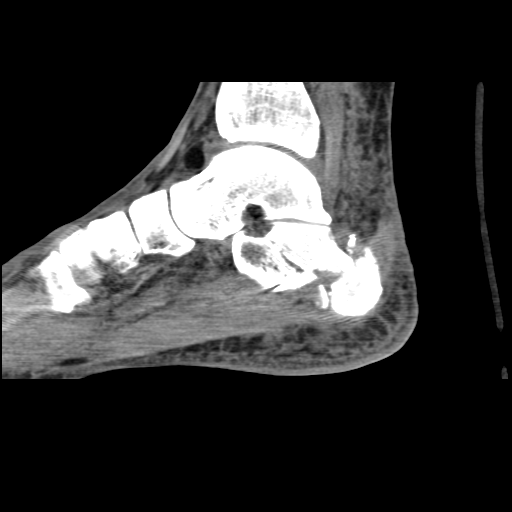

[Series 402: cor heel bone · axial · 0.39mm/px · z∈[-183,-149]mm · 2 of 53 slices shown, 3 images]
[im 18/53  soft-tissue]
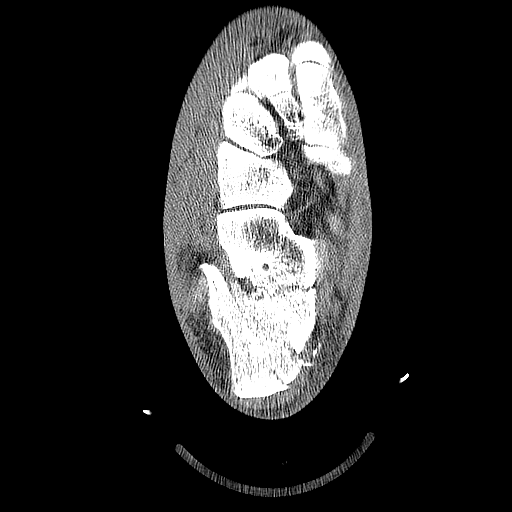
[im 18/53  bone]
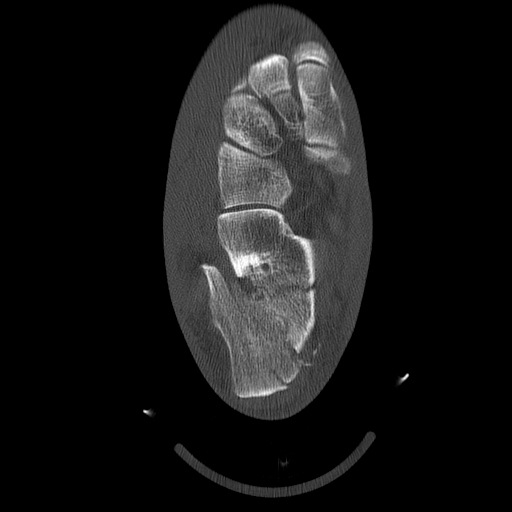
[im 35/53  bone]
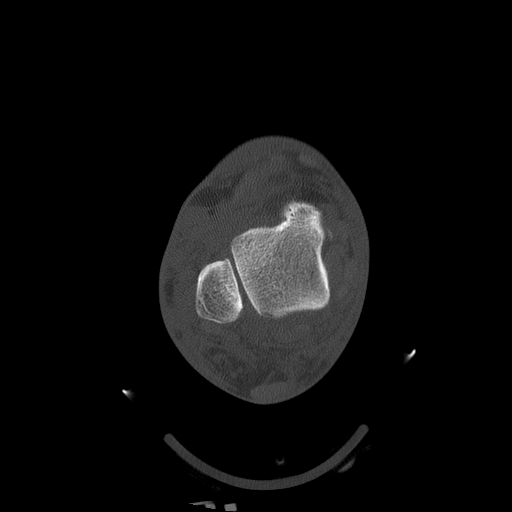

[7 of 20 positions shown; findings below may reference images not displayed]

FINDINGS: As seen on plain films, the patient has a comminuted fracture of the
calcaneus. The main component of the fracture is oblique in
orientation extending from the posterior and medial calcaneus in an
anterior and lateral orientation through the posterior facet of the
subtalar joint and the body of the calcaneus laterally. At the
lateral aspect of the posterior facet, the fracture is distracted
0.7 cm from left right for a length of 1.2 cm AP. There is a
nondisplaced component of the fracture through the calcaneus at the
middle facet of the subtalar joint. The fracture does not involve
the calcaneocuboid joint. No other fracture is identified.

Extensive soft tissue swelling and hematoma are seen about the
ankle. No tendon entrapment is identified.
IMPRESSION: Comminuted fracture of the calcaneus most consistent with Pepe Alves
type 2A injury. As described above there is a markedly distracted
component through lateral aspect of the posterior facet of the
subtalar joint and a nondisplaced component through the middle facet
of the subtalar joint.

## 2017-05-25 HISTORY — PX: JOINT REPLACEMENT: SHX530

## 2021-01-30 LAB — EXTERNAL GENERIC LAB PROCEDURE: COLOGUARD: NEGATIVE

## 2021-01-30 LAB — COLOGUARD: COLOGUARD: NEGATIVE

## 2023-10-02 ENCOUNTER — Emergency Department (HOSPITAL_BASED_OUTPATIENT_CLINIC_OR_DEPARTMENT_OTHER)

## 2023-10-02 ENCOUNTER — Emergency Department (HOSPITAL_BASED_OUTPATIENT_CLINIC_OR_DEPARTMENT_OTHER)
Admission: EM | Admit: 2023-10-02 | Discharge: 2023-10-02 | Disposition: A | Discharge status: Home / Self Care | Attending: Emergency Medicine | Admitting: Emergency Medicine

## 2023-10-02 ENCOUNTER — Other Ambulatory Visit: Payer: Self-pay

## 2023-10-02 ENCOUNTER — Encounter (HOSPITAL_BASED_OUTPATIENT_CLINIC_OR_DEPARTMENT_OTHER): Payer: Self-pay

## 2023-10-02 DIAGNOSIS — Z7982 Long term (current) use of aspirin: Secondary | ICD-10-CM | POA: Insufficient documentation

## 2023-10-02 DIAGNOSIS — R2231 Localized swelling, mass and lump, right upper limb: Secondary | ICD-10-CM | POA: Diagnosis present

## 2023-10-02 DIAGNOSIS — Z79899 Other long term (current) drug therapy: Secondary | ICD-10-CM | POA: Insufficient documentation

## 2023-10-02 DIAGNOSIS — I82621 Acute embolism and thrombosis of deep veins of right upper extremity: Secondary | ICD-10-CM | POA: Insufficient documentation

## 2023-10-02 LAB — COMPREHENSIVE METABOLIC PANEL WITH GFR
ALT: 22 U/L (ref 0–44)
AST: 24 U/L (ref 15–41)
Albumin: 4 g/dL (ref 3.5–5.0)
Alkaline Phosphatase: 73 U/L (ref 38–126)
Anion gap: 8 (ref 5–15)
BUN: 15 mg/dL (ref 6–20)
CO2: 27 mmol/L (ref 22–32)
Calcium: 9.6 mg/dL (ref 8.9–10.3)
Chloride: 105 mmol/L (ref 98–111)
Creatinine, Ser: 0.8 mg/dL (ref 0.44–1.00)
GFR, Estimated: >60 mL/min (ref >60)
Glucose, Bld: 92 mg/dL (ref 70–99)
Potassium: 4.7 mmol/L (ref 3.5–5.1)
Sodium: 140 mmol/L (ref 135–145)
Total Bilirubin: 0.7 mg/dL (ref 0.0–1.2)
Total Protein: 6.9 g/dL (ref 6.5–8.1)

## 2023-10-02 LAB — CBC WITH DIFFERENTIAL/PLATELET
Abs Immature Granulocytes: 0.02 10*3/uL (ref 0.00–0.07)
Basophils Absolute: 0 10*3/uL (ref 0.0–0.1)
Basophils Relative: 0 %
Eosinophils Absolute: 0.1 10*3/uL (ref 0.0–0.5)
Eosinophils Relative: 1 %
HCT: 47.1 % — ABNORMAL HIGH (ref 36.0–46.0)
Hemoglobin: 15.4 g/dL — ABNORMAL HIGH (ref 12.0–15.0)
Immature Granulocytes: 0 %
Lymphocytes Relative: 21 %
Lymphs Abs: 1.6 10*3/uL (ref 0.7–4.0)
MCH: 28.4 pg (ref 26.0–34.0)
MCHC: 32.7 g/dL (ref 30.0–36.0)
MCV: 86.9 fL (ref 80.0–100.0)
Monocytes Absolute: 0.6 10*3/uL (ref 0.1–1.0)
Monocytes Relative: 8 %
Neutro Abs: 5.3 10*3/uL (ref 1.7–7.7)
Neutrophils Relative %: 70 %
Platelets: 203 10*3/uL (ref 150–400)
RBC: 5.42 MIL/uL — ABNORMAL HIGH (ref 3.87–5.11)
RDW: 12.4 % (ref 11.5–15.5)
WBC: 7.7 10*3/uL (ref 4.0–10.5)
nRBC: 0 % (ref 0.0–0.2)

## 2023-10-02 LAB — PROTIME-INR
INR: 1 (ref 0.8–1.2)
Prothrombin Time: 13.6 s (ref 11.4–15.2)

## 2023-10-02 MED ORDER — IOHEXOL 350 MG/ML SOLN
150.0000 mL | Freq: Once | INTRAVENOUS | Status: AC | PRN
  Administered 2023-10-02: 125 mL via INTRAVENOUS

## 2023-10-02 MED ORDER — OXYCODONE HCL 5 MG PO TABS: 5.0000 mg | ORAL_TABLET | Freq: Four times a day (QID) | ORAL | 0 refills | Status: DC | PRN

## 2023-10-02 MED ORDER — SODIUM CHLORIDE 0.9 % IV BOLUS
1000.0000 mL | Freq: Once | INTRAVENOUS | Status: AC
  Administered 2023-10-02: 1000 mL via INTRAVENOUS

## 2023-10-02 MED ORDER — APIXABAN (ELIQUIS) VTE STARTER PACK (10MG AND 5MG): ORAL_TABLET | ORAL | 0 refills | Status: DC

## 2023-10-02 MED ORDER — APIXABAN 2.5 MG PO TABS
10.0000 mg | ORAL_TABLET | Freq: Once | ORAL | Status: AC
  Administered 2023-10-02: 10 mg via ORAL
  Filled 2023-10-02: qty 4

## 2023-10-02 NOTE — ED Notes (Signed)
 Patient transported to Ultrasound

## 2023-10-02 NOTE — ED Notes (Signed)
 ED Provider at bedside.

## 2023-10-02 NOTE — ED Notes (Signed)
 Spoke with CT tech regarding delay in CT crossing over in Epic. States will call [facility] about same.

## 2023-10-02 NOTE — ED Provider Notes (Signed)
 [place] [facility] Provider Note   CSN: [id number] Arrival date & time: 10/02/23  1023     History {Add pertinent medical, surgical, social history, OB history to HPI:1} Chief Complaint  Patient presents with   Arm Swelling    Carol Jacobs is a 63 y.o. female.  HPI Patient presents with swelling of her right upper extremity.  Began earlier today.  Was sent flowers.  No trauma.  No chest pain.  Some tightness but not frank pain in the arm.  No chest pain or difficulty breathing.  Has been putting topical chemotherapy on her upper chest for precancerous lesions.  No chest pain or trouble breathing.  Has not had swelling like this before.   Past Medical History:  Diagnosis Date   Hypotension    Thyroid disease     Home Medications Prior to Admission medications   Medication Sig Start Date End Date Taking? Authorizing Provider  aspirin  EC 325 MG tablet Take 1 tablet (325 mg total) by mouth daily. 05/11/14   Timothy Ford, MD  gabapentin  (NEURONTIN ) 300 MG capsule Take 1 capsule (300 mg total) by mouth at bedtime. 05/13/14   Arnie Lao, MD  levothyroxine  (SYNTHROID , LEVOTHROID) 100 MCG tablet Take 100 mcg by mouth daily before breakfast.    [provider]  methocarbamol  (ROBAXIN ) 500 MG tablet Take 1 tablet (500 mg total) by mouth every 6 (six) hours as needed for muscle spasms. 05/12/14   Arnie Lao, MD  methylphenidate  (METADATE  ER) 20 MG ER tablet Take 20 mg by mouth 2 (two) times daily. 02/04/14   [provider]  Multiple Vitamins-Calcium (ONE-A-DAY WOMENS PO) Take 1 tablet by mouth daily.    [provider]  Omega-3 Fatty Acids (FISH OIL PO) Take 2 capsules by mouth daily.    [provider]  ondansetron  (ZOFRAN ) 4 MG tablet Take 1 tablet (4 mg total) by mouth every 6 (six) hours. 05/08/14   Antionette Bath, MD  oxyCODONE -acetaminophen  (PERCOCET) 5-325 MG per tablet Take 1-2  tablets by mouth every 6 (six) hours as needed. 05/08/14   Antionette Bath, MD  oxyCODONE -acetaminophen  (ROXICET) 5-325 MG per tablet Take 1 tablet by mouth every 4 (four) hours as needed for severe pain. 05/11/14   Timothy Ford, MD  Potassium (POTASSIMIN PO) Take 1 tablet by mouth daily as needed.    [provider]  testosterone  (ANDROGEL ) 50 MG/5GM (1%) GEL Place 5 g onto the skin daily.    [provider]      Allergies    Ciprofloxacin    Review of Systems   Review of Systems  Physical Exam Updated Vital Signs BP (!) 112/47   Pulse 70   Temp 98.7 F (37.1 C) (Oral)   Resp 16   Wt 62.6 kg   SpO2 97%   BMI 22.27 kg/m  Physical Exam Vitals and nursing note reviewed.  HENT:     Head: Atraumatic.  Cardiovascular:     Rate and Rhythm: Regular rhythm.  Pulmonary:     Comments: Some erythema upper chest wall. Musculoskeletal:     Comments: Some edema of right upper extremity.  Goes up the shoulder.  Neurovascular intact in hand.  Radial pulse palpable.  Similar warmth between left and right side.  Skin:    Capillary Refill: Capillary refill takes less than 2 seconds.  Neurological:     Mental Status: She is alert and oriented to person, place, and time.  ED Results / Procedures / Treatments   Labs (all labs ordered are listed, but only abnormal results are displayed) Labs Reviewed  CBC WITH DIFFERENTIAL/PLATELET  COMPREHENSIVE METABOLIC PANEL WITH GFR  PROTIME-INR    EKG None  Radiology No results found.  Procedures Procedures  {Document cardiac monitor, telemetry assessment procedure when appropriate:1}  Medications Ordered in ED Medications - No data to display  ED Course/ Medical Decision Making/ A&P   {   Click here for ABCD2, HEART and other calculatorsREFRESH Note before signing :1}                              Medical Decision Making Amount and/or Complexity of Data Reviewed Labs: ordered. Radiology:  ordered.  Risk Prescription drug management.   Swelling of right upper extremity.  Differential diagnosis does include DVTs in the arm.  But also clots more centrally.  Causes such as SVC syndrome from malignancy considered.  Will get x-ray.  Will get Doppler.  Will get basic blood work.  Blood work reassuring.  However ultrasound does show clot in the subclavian/innominate vein.  Discussed with Dr. Vikki Graves from vascular surgery.  Will get CT venogram to further evaluate.  Will rediscuss with him.   {Document critical care time when appropriate:1} {Document review of labs and clinical decision tools ie heart score, Chads2Vasc2 etc:1}  {Document your independent review of radiology images, and any outside records:1} {Document your discussion with family members, caretakers, and with consultants:1} {Document social determinants of health affecting pt's care:1} {Document your decision making why or why not admission, treatments were needed:1} Final Clinical Impression(s) / ED Diagnoses Final diagnoses:  None    Rx / DC Orders ED Discharge Orders     None

## 2023-10-02 NOTE — ED Triage Notes (Signed)
 Pt reports right arm swelling that began 1 1/2 hours ago. Pt had been moving flowers around when swelling began . Started in right upper arm and now has moved down arm. Upper arm tight. Palpable pulse. Cool to touch

## 2023-10-02 NOTE — ED Notes (Signed)
 CT still has not crossed over into Epic. EDP to call directly to [facility]. Husband informed of same

## 2023-10-02 NOTE — ED Provider Notes (Signed)
   ED Course / MDM   Clinical Course as of 10/02/23 2048  Sat Oct 02, 2023  1505 Received sign out from Dr. Val Garin pending CT venogram. Has RUE DVT. Was discussed with Dr. Vikki Graves with vascular surgery who wants venogram and reach back out.  [WS]  2046 Significant delay in obtaining CT venogram results.  CT venogram shows previously seen DVT in the right upper extremity.  There is some tiny nonocclusive thrombus in the lungs but no sign of any heart strain.  Will start on Eliquis.  Discussed how to take Eliquis, reasons to return to the emergency department, risk of bleeding.  Will give first dose here as pharmacy closed.  Discussed findings with Dr. Vikki Graves with vascular surgery, he thinks it should be safe for patient to be discharged. he will call patient to schedule follow-up.  Will prescribe small amount of oxycodone  as needed for pain. Discussed safe use of this medicine. Will discharge patient to home. All questions answered. Patient comfortable with plan of discharge. Return precautions discussed with patient and specified on the after visit summary.  [WS]    Clinical Course User Index [WS] Mordecai Applebaum, MD   Medical Decision Making Amount and/or Complexity of Data Reviewed Labs: ordered. Radiology: ordered.  Risk Prescription drug management.         Mordecai Applebaum, MD 10/02/23 2048

## 2023-10-02 NOTE — Discharge Instructions (Addendum)
 We evaluated you for your arm swelling.  Your testing shows a blood clot in your right arm.  We discussed this with the vascular specialist Dr. Vikki Graves, who does not think you need to be admitted to the hospital for this.  We have started you on an oral blood thinner called Eliquis.  Please keep your arm elevated.  Please take at 1000 mg of Tylenol  every 6 hours as needed for pain.  You can also apply ice to help with pain and swelling.  We have prescribed you some oxycodone  to take if needed for severe pain, do not drive or drink alcohol while taking this medication as it may make you drowsy.  Please return to the emergency department if you develop any new or worsening symptoms such as chest pain, difficulty breathing, worsening swelling, fevers or chills, or any other concerning symptoms.  Eliquis also does increase your risk of bleeding.  If you develop any symptoms like severe headaches, rectal bleeding, vomiting blood, coughing blood, or any other concerning symptoms, please return to the emergency department.  If you hit your head, please also come back to the emergency department as we may need to get a CT scan of your head.

## 2023-10-02 NOTE — ED Notes (Signed)
 R arm elevated with pillows

## 2023-10-02 NOTE — ED Notes (Signed)
 Informed that imaging should be be ready shortly . EDP informed of same

## 2023-10-27 ENCOUNTER — Telehealth: Payer: Self-pay

## 2023-10-27 ENCOUNTER — Ambulatory Visit: Attending: Vascular Surgery | Admitting: Vascular Surgery

## 2023-10-27 ENCOUNTER — Encounter: Payer: Self-pay | Admitting: Vascular Surgery

## 2023-10-27 VITALS — BP 106/48 | HR 64 | Temp 99.0°F | Ht 66.0 in | Wt 141.0 lb

## 2023-10-27 DIAGNOSIS — I82890 Acute embolism and thrombosis of other specified veins: Secondary | ICD-10-CM

## 2023-10-27 MED ORDER — APIXABAN 5 MG PO TABS: 5.0000 mg | ORAL_TABLET | Freq: Two times a day (BID) | ORAL | 2 refills | Status: AC

## 2023-10-27 NOTE — Telephone Encounter (Signed)
 Dr Vikki Graves request for Right UE venogram faxed to [facility]
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
## 2023-11-01 ENCOUNTER — Encounter (HOSPITAL_COMMUNITY): Admission: RE | Payer: Self-pay | Source: Ambulatory Visit

## 2023-11-01 ENCOUNTER — Ambulatory Visit (HOSPITAL_COMMUNITY): Admission: RE | Admit: 2023-11-01 | Source: Ambulatory Visit | Admitting: Vascular Surgery

## 2023-11-01 SURGERY — A/V SHUNT INTERVENTION
Anesthesia: LOCAL

## 2023-11-03 ENCOUNTER — Other Ambulatory Visit: Payer: Self-pay

## 2023-11-03 DIAGNOSIS — I82890 Acute embolism and thrombosis of other specified veins: Secondary | ICD-10-CM

## 2023-11-08 ENCOUNTER — Encounter (HOSPITAL_COMMUNITY)
Admission: RE | Disposition: A | Discharge status: Home / Self Care | Payer: Self-pay | Source: Ambulatory Visit | Attending: Vascular Surgery

## 2023-11-08 ENCOUNTER — Other Ambulatory Visit: Payer: Self-pay

## 2023-11-08 ENCOUNTER — Ambulatory Visit (HOSPITAL_COMMUNITY)
Admission: RE | Admit: 2023-11-08 | Discharge: 2023-11-08 | Disposition: A | Discharge status: Home / Self Care | Payer: Self-pay | Source: Ambulatory Visit | Attending: Vascular Surgery | Admitting: Vascular Surgery

## 2023-11-08 DIAGNOSIS — I82721 Chronic embolism and thrombosis of deep veins of right upper extremity: Secondary | ICD-10-CM | POA: Diagnosis present

## 2023-11-08 DIAGNOSIS — I82B21 Chronic embolism and thrombosis of right subclavian vein: Secondary | ICD-10-CM | POA: Diagnosis not present

## 2023-11-08 DIAGNOSIS — I871 Compression of vein: Secondary | ICD-10-CM

## 2023-11-08 DIAGNOSIS — E270 Other adrenocortical overactivity: Secondary | ICD-10-CM | POA: Diagnosis not present

## 2023-11-08 DIAGNOSIS — I82A21 Chronic embolism and thrombosis of right axillary vein: Secondary | ICD-10-CM | POA: Diagnosis not present

## 2023-11-08 DIAGNOSIS — I82B11 Acute embolism and thrombosis of right subclavian vein: Secondary | ICD-10-CM

## 2023-11-08 DIAGNOSIS — I82A11 Acute embolism and thrombosis of right axillary vein: Secondary | ICD-10-CM | POA: Diagnosis not present

## 2023-11-08 DIAGNOSIS — I82890 Acute embolism and thrombosis of other specified veins: Secondary | ICD-10-CM

## 2023-11-08 DIAGNOSIS — Z7901 Long term (current) use of anticoagulants: Secondary | ICD-10-CM | POA: Insufficient documentation

## 2023-11-08 HISTORY — PX: PERIPHERAL VASCULAR THROMBECTOMY: CATH118306

## 2023-11-08 HISTORY — PX: UPPER EXTREMITY VENOGRAPHY: CATH118272

## 2023-11-08 LAB — POCT I-STAT, CHEM 8
BUN: 14 mg/dL (ref 6–20)
BUN: 18 mg/dL (ref 6–20)
Calcium, Ion: 1.24 mmol/L (ref 1.15–1.40)
Calcium, Ion: 1.25 mmol/L (ref 1.15–1.40)
Chloride: 101 mmol/L (ref 98–111)
Chloride: 102 mmol/L (ref 98–111)
Creatinine, Ser: 0.8 mg/dL (ref 0.44–1.00)
Creatinine, Ser: 0.8 mg/dL (ref 0.44–1.00)
Glucose, Bld: 90 mg/dL (ref 70–99)
Glucose, Bld: 92 mg/dL (ref 70–99)
HCT: 43 % (ref 36.0–46.0)
HCT: 45 % (ref 36.0–46.0)
Hemoglobin: 14.6 g/dL (ref 12.0–15.0)
Hemoglobin: 15.3 g/dL — ABNORMAL HIGH (ref 12.0–15.0)
Potassium: 4.2 mmol/L (ref 3.5–5.1)
Potassium: 6.3 mmol/L (ref 3.5–5.1)
Sodium: 136 mmol/L (ref 135–145)
Sodium: 138 mmol/L (ref 135–145)
TCO2: 25 mmol/L (ref 22–32)
TCO2: 28 mmol/L (ref 22–32)

## 2023-11-08 SURGERY — UPPER EXTREMITY VENOGRAPHY
Anesthesia: LOCAL | Laterality: Right

## 2023-11-08 MED ORDER — MIDAZOLAM HCL 2 MG/2ML IJ SOLN
INTRAMUSCULAR | Status: DC | PRN
  Administered 2023-11-08: .5 mg via INTRAVENOUS

## 2023-11-08 MED ORDER — OXYCODONE HCL 5 MG PO TABS: 5.0000 mg | ORAL_TABLET | ORAL | Status: DC | PRN

## 2023-11-08 MED ORDER — HEPARIN SODIUM (PORCINE) 1000 UNIT/ML IJ SOLN
INTRAMUSCULAR | Status: DC | PRN
Start: 2023-11-08 — End: 2023-11-08
  Administered 2023-11-08: 6000 [IU] via INTRAVENOUS

## 2023-11-08 MED ORDER — SODIUM CHLORIDE 0.9 % IV SOLN: INTRAVENOUS | Status: DC

## 2023-11-08 MED ORDER — SODIUM CHLORIDE 0.9% FLUSH: 3.0000 mL | INTRAVENOUS | Status: DC | PRN

## 2023-11-08 MED ORDER — SODIUM CHLORIDE 0.9 % IV SOLN: 250.0000 mL | INTRAVENOUS | Status: DC | PRN

## 2023-11-08 MED ORDER — FENTANYL CITRATE (PF) 100 MCG/2ML IJ SOLN
INTRAMUSCULAR | Status: DC | PRN
  Administered 2023-11-08: 25 ug via INTRAVENOUS

## 2023-11-08 MED ORDER — ACETAMINOPHEN 325 MG PO TABS: 650.0000 mg | ORAL_TABLET | ORAL | Status: DC | PRN

## 2023-11-08 MED ORDER — FENTANYL CITRATE (PF) 100 MCG/2ML IJ SOLN
INTRAMUSCULAR | Status: AC
  Filled 2023-11-08: qty 2

## 2023-11-08 MED ORDER — SODIUM CHLORIDE 0.9% FLUSH
3.0000 mL | Freq: Two times a day (BID) | INTRAVENOUS | Status: DC
Start: 2023-11-09 — End: 2023-11-08

## 2023-11-08 MED ORDER — LIDOCAINE HCL (PF) 1 % IJ SOLN
INTRAMUSCULAR | Status: DC | PRN
  Administered 2023-11-08: 5 mL via INTRADERMAL

## 2023-11-08 MED ORDER — ONDANSETRON HCL 4 MG/2ML IJ SOLN: 4.0000 mg | Freq: Four times a day (QID) | INTRAMUSCULAR | Status: DC | PRN

## 2023-11-08 MED ORDER — IODIXANOL 320 MG/ML IV SOLN
INTRAVENOUS | Status: DC | PRN
Start: 2023-11-08 — End: 2023-11-08
  Administered 2023-11-08: 27 mL

## 2023-11-08 MED ORDER — HEPARIN (PORCINE) IN NACL 1000-0.9 UT/500ML-% IV SOLN
INTRAVENOUS | Status: DC | PRN
  Administered 2023-11-08: 500 mL

## 2023-11-08 MED ORDER — SODIUM CHLORIDE 0.9 % WEIGHT BASED INFUSION: 1.0000 mL/kg/h | INTRAVENOUS | Status: DC

## 2023-11-08 MED ORDER — HEPARIN SODIUM (PORCINE) 1000 UNIT/ML IJ SOLN
INTRAMUSCULAR | Status: AC
  Filled 2023-11-08: qty 10

## 2023-11-08 MED ORDER — LIDOCAINE HCL (PF) 1 % IJ SOLN
INTRAMUSCULAR | Status: AC
  Filled 2023-11-08: qty 30

## 2023-11-08 MED ORDER — MIDAZOLAM HCL 2 MG/2ML IJ SOLN
INTRAMUSCULAR | Status: AC
  Filled 2023-11-08: qty 2

## 2023-11-08 MED ORDER — MORPHINE SULFATE (PF) 2 MG/ML IV SOLN: 2.0000 mg | INTRAVENOUS | Status: DC | PRN

## 2023-11-08 MED ORDER — HYDRALAZINE HCL 20 MG/ML IJ SOLN: 5.0000 mg | INTRAMUSCULAR | Status: DC | PRN

## 2023-11-08 MED ORDER — LABETALOL HCL 5 MG/ML IV SOLN: 10.0000 mg | INTRAVENOUS | Status: DC | PRN

## 2023-11-08 SURGICAL SUPPLY — 13 items
BALLOON MUSTANG 8X60X75 (BALLOONS) IMPLANT
CATH ANGIO 5F BER2 65CM (CATHETERS) IMPLANT
CATH THROMB INTHRILL 4-10 65 (CATHETERS) IMPLANT
COVER DOME SNAP 22 D (MISCELLANEOUS) IMPLANT
GLIDEWIRE ADV .035X180CM (WIRE) IMPLANT
KIT ENCORE 26 ADVANTAGE (KITS) IMPLANT
KIT MICROPUNCTURE NIT STIFF (SHEATH) IMPLANT
SHEATH INTHRILL 8FR 6 (SHEATH) IMPLANT
SHEATH PINNACLE 8F 10CM (SHEATH) IMPLANT
SHEATH PROBE COVER 6X72 (BAG) IMPLANT
TRAY PV CATH (CUSTOM PROCEDURE TRAY) ×1 IMPLANT
TUBING CIL FLEX 10 FLL-RA (TUBING) IMPLANT
WIRE BENTSON .035X145CM (WIRE) IMPLANT

## 2023-11-08 NOTE — Interval H&P Note (Signed)
 History and Physical Interval Note:  11/08/2023 12:02 PM  Carol Jacobs  has presented today for surgery, with the diagnosis of Paget-schroetter syndrome.  The various methods of treatment have been discussed with the patient and family. After consideration of risks, benefits and other options for treatment, the patient has consented to  Procedure(s): UPPER EXTREMITY VENOGRAPHY (Right) PERIPHERAL VASCULAR THROMBECTOMY (Right) as a surgical intervention.  The patient's history has been reviewed, patient examined, no change in status, stable for surgery.  I have reviewed the patient's chart and labs.  Questions were answered to the patient's satisfaction.     Angela Kell

## 2023-11-08 NOTE — Discharge Instructions (Signed)
 Brachial Site Care   This sheet gives you information about how to care for yourself after your procedure. Your health care provider may also give you more specific instructions. If you have problems or questions, contact your health care provider. What can I expect after the procedure? After the procedure, it is common to have: Bruising and tenderness at the catheter insertion area. Follow these instructions at home:  Insertion site care Follow instructions from your health care provider about how to take care of your insertion site. Make sure you: Wash your hands with soap and water before you change your bandage (dressing). If soap and water are not available, use hand sanitizer. Remove your dressing as told by your health care provider. In 24 hours Check your insertion site every day for signs of infection. Check for: Redness, swelling, or pain. Pus or a bad smell. Warmth. You may shower 24-48 hours after the procedure. Do not apply powder or lotion to the site.  Activity For 24 hours after the procedure, or as directed by your health care provider: Do not push or pull heavy objects with the affected arm. Do not drive yourself home from the hospital or clinic. You may drive 24 hours after the procedure unless your health care provider tells you not to. Do not lift anything that is heavier than 10 lb (4.5 kg), or the limit that you are told, until your health care provider says that it is safe.  For 3 days

## 2023-11-08 NOTE — Op Note (Signed)
    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 Vikki Graves, MD Procedure Performed: 1.  Percutaneous ultrasound-guided cannulation of the right brachial vein 2.  Right upper extremity venogram 3.  Percutaneous mechanical thrombectomy using In Thrill device right upper extremity veins 4.  Balloon venoplasty right subclavian vein with 8 mm balloon 5.  Moderate sedation with fentanyl  and Versed  for 36 minutes  Indications: 63 year old female with clinical Paget Schroeder syndrome now with improvement in her swelling in the right upper extremity with extensive DVT and concern for first rib compression of the vein now indicated for right upper extremity venogram possible mechanical thrombectomy.  Findings: The right axillary subclavian vein was occluded and reconstituted the right innominate vein via collaterals.  After mechanical thrombectomy we retrieved mostly chronic appearing thrombus with only minimal acute thrombus and perform balloon venoplasty which created a channel for previously the subclavian vein was occluded.  Plan will be for immediate resumption of Eliquis  and first rib resection with right upper extremity venogram in the near future.     Procedure:  The patient was identified in the holding area and taken to room 8.  The patient was then placed supine on the table and prepped and draped in the usual sterile fashion.  A time out was called.  Ultrasound was used to evaluate the right brachial vein which was spared.  The area was anesthetized 1% lidocaine  and this was cannulated by functional followed by wire sheath.  Ultrasound was saved per record.  Concomitantly she was administered fentanyl  and Versed  as moderate sedation and her vital signs were monitored throughout the case by bedside nursing.  We then performed  right upper extremity venogram with the above findings we placed an 8 French sheath and the patient was fully heparinized.  We then used a Glidewire advantage and KMP catheter to cross the occluded subclavian vein and confirmed intraluminal access in the right atrium and placed the Glidewire advantage into the IVC.  We began with mechanical thrombectomy of the left exchanging for the 8 [place] sheath and we made 3 passes until the third was clean which retrieved mostly chronic thrombus but also subacute thrombus.  We then performed balloon venoplasty with an 8 mm balloon and then again passed the In Thrill device and again performed balloon venoplasty.  At completion there was a much improved channel through the axillary and subclavian veins with no evidence of recurrent thrombus.  We remove the sheath and wire and suture-ligated the cannulation site.  Plan will be for resumption of Eliquis  and first rib resection with venogram in the near future.  She tolerated the procedure without any complication.  Contrast: 27cc   Mikiah Demond C. Vikki Graves, MD [facility] [place] Office: [phone] Pager: [phone]

## 2023-11-09 ENCOUNTER — Other Ambulatory Visit: Payer: Self-pay

## 2023-11-09 ENCOUNTER — Encounter (HOSPITAL_COMMUNITY): Payer: Self-pay | Admitting: Vascular Surgery

## 2023-11-09 DIAGNOSIS — I82890 Acute embolism and thrombosis of other specified veins: Secondary | ICD-10-CM

## 2023-11-12 ENCOUNTER — Encounter (HOSPITAL_COMMUNITY): Payer: Self-pay | Admitting: Vascular Surgery

## 2023-11-12 ENCOUNTER — Other Ambulatory Visit: Payer: Self-pay

## 2023-11-12 NOTE — Progress Notes (Signed)
 PCP - [facility] Cardiologist - none  Chest x-ray - 10/02/23, 10/25/23 CE EKG - 11/08/23 Stress Test - n/a ECHO - n/a Cardiac Cath - n/a  ICD Pacemaker/Loop - n/a  Sleep Study -  n/a CPAP - none  Diabetes -n/a  Blood Thinner Instructions:  Stop Eliquis  after Sunday, 11/14/23 per MD.  Aspirin  Instructions: n/a  NPO  Anesthesia review: no  STOP now taking any Aspirin  (unless otherwise instructed by your surgeon), Aleve, Naproxen, Ibuprofen, Motrin, Advil, Goody's, BC's, all herbal medications, fish oil, and all vitamins.   Coronavirus Screening Do you have any of the following symptoms:  Cough yes/no: No Fever (>100.60F)  yes/no: No Runny nose yes/no: No Sore throat yes/no: No Difficulty breathing/shortness of breath  yes/no: No  Have you traveled in the last 14 days and where? yes/no: No  Patient verbalized understanding of instructions that were given via phone.

## 2023-11-16 ENCOUNTER — Inpatient Hospital Stay (HOSPITAL_COMMUNITY)
Admission: RE | Admit: 2023-11-16 | Discharge: 2023-11-17 | DRG: 253 | Disposition: A | Discharge status: Home / Self Care | Source: Ambulatory Visit | Attending: Vascular Surgery | Admitting: Vascular Surgery

## 2023-11-16 ENCOUNTER — Encounter (HOSPITAL_COMMUNITY): Payer: Self-pay | Admitting: Vascular Surgery

## 2023-11-16 ENCOUNTER — Inpatient Hospital Stay (HOSPITAL_COMMUNITY)

## 2023-11-16 ENCOUNTER — Other Ambulatory Visit: Payer: Self-pay

## 2023-11-16 ENCOUNTER — Inpatient Hospital Stay (HOSPITAL_COMMUNITY): Payer: Self-pay | Admitting: Anesthesiology

## 2023-11-16 ENCOUNTER — Encounter (HOSPITAL_COMMUNITY)
Admission: RE | Disposition: A | Discharge status: Home / Self Care | Payer: Self-pay | Source: Ambulatory Visit | Attending: Vascular Surgery

## 2023-11-16 DIAGNOSIS — M1990 Unspecified osteoarthritis, unspecified site: Secondary | ICD-10-CM | POA: Diagnosis present

## 2023-11-16 DIAGNOSIS — I82409 Acute embolism and thrombosis of unspecified deep veins of unspecified lower extremity: Principal | ICD-10-CM | POA: Diagnosis present

## 2023-11-16 DIAGNOSIS — I82601 Acute embolism and thrombosis of unspecified veins of right upper extremity: Secondary | ICD-10-CM | POA: Diagnosis not present

## 2023-11-16 DIAGNOSIS — Z96641 Presence of right artificial hip joint: Secondary | ICD-10-CM | POA: Diagnosis present

## 2023-11-16 DIAGNOSIS — I739 Peripheral vascular disease, unspecified: Secondary | ICD-10-CM | POA: Diagnosis present

## 2023-11-16 DIAGNOSIS — Z888 Allergy status to other drugs, medicaments and biological substances status: Secondary | ICD-10-CM

## 2023-11-16 DIAGNOSIS — F988 Other specified behavioral and emotional disorders with onset usually occurring in childhood and adolescence: Secondary | ICD-10-CM | POA: Diagnosis present

## 2023-11-16 DIAGNOSIS — I82621 Acute embolism and thrombosis of deep veins of right upper extremity: Principal | ICD-10-CM | POA: Diagnosis present

## 2023-11-16 DIAGNOSIS — Z7901 Long term (current) use of anticoagulants: Secondary | ICD-10-CM

## 2023-11-16 DIAGNOSIS — Z881 Allergy status to other antibiotic agents status: Secondary | ICD-10-CM | POA: Diagnosis not present

## 2023-11-16 DIAGNOSIS — Z90710 Acquired absence of both cervix and uterus: Secondary | ICD-10-CM

## 2023-11-16 DIAGNOSIS — I82890 Acute embolism and thrombosis of other specified veins: Secondary | ICD-10-CM | POA: Diagnosis present

## 2023-11-16 DIAGNOSIS — Z9889 Other specified postprocedural states: Secondary | ICD-10-CM

## 2023-11-16 DIAGNOSIS — I871 Compression of vein: Secondary | ICD-10-CM | POA: Diagnosis present

## 2023-11-16 DIAGNOSIS — I82401 Acute embolism and thrombosis of unspecified deep veins of right lower extremity: Secondary | ICD-10-CM

## 2023-11-16 DIAGNOSIS — E039 Hypothyroidism, unspecified: Secondary | ICD-10-CM | POA: Diagnosis present

## 2023-11-16 DIAGNOSIS — Z79899 Other long term (current) drug therapy: Secondary | ICD-10-CM

## 2023-11-16 HISTORY — DX: Hypothyroidism, unspecified: E03.9

## 2023-11-16 HISTORY — DX: Other specified postprocedural states: Z98.890

## 2023-11-16 HISTORY — DX: Other specified behavioral and emotional disorders with onset usually occurring in childhood and adolescence: F98.8

## 2023-11-16 HISTORY — DX: Other specified postprocedural states: R11.2

## 2023-11-16 HISTORY — DX: Myoneural disorder, unspecified: G70.9

## 2023-11-16 HISTORY — DX: Anemia, unspecified: D64.9

## 2023-11-16 HISTORY — DX: Acute embolism and thrombosis of other specified veins: I82.890

## 2023-11-16 HISTORY — PX: UPPER EXTREMITY ANGIOGRAM: SHX6310

## 2023-11-16 HISTORY — DX: Other intervertebral disc degeneration, lumbar region without mention of lumbar back pain or lower extremity pain: M51.369

## 2023-11-16 HISTORY — DX: Actinic keratosis: L57.0

## 2023-11-16 HISTORY — DX: Unilateral primary osteoarthritis, right hip: M16.11

## 2023-11-16 HISTORY — DX: Malignant (primary) neoplasm, unspecified: C80.1

## 2023-11-16 HISTORY — PX: RIB RESECTION: SHX5077

## 2023-11-16 LAB — CBC
HCT: 44.7 % (ref 36.0–46.0)
HCT: 44.5 % (ref 36.0–46.0)
Hemoglobin: 14.4 g/dL (ref 12.0–15.0)
Hemoglobin: 14.4 g/dL (ref 12.0–15.0)
MCH: 28.6 pg (ref 26.0–34.0)
MCH: 28.2 pg (ref 26.0–34.0)
MCHC: 32.2 g/dL (ref 30.0–36.0)
MCHC: 32.4 g/dL (ref 30.0–36.0)
MCV: 88.7 fL (ref 80.0–100.0)
MCV: 87.1 fL (ref 80.0–100.0)
Platelets: 222 10*3/uL (ref 150–400)
Platelets: 263 10*3/uL (ref 150–400)
RBC: 5.04 MIL/uL (ref 3.87–5.11)
RBC: 5.11 MIL/uL (ref 3.87–5.11)
RDW: 12.4 % (ref 11.5–15.5)
RDW: 12.3 % (ref 11.5–15.5)
WBC: 5.6 10*3/uL (ref 4.0–10.5)
WBC: 11.7 10*3/uL — ABNORMAL HIGH (ref 4.0–10.5)
nRBC: 0 % (ref 0.0–0.2)
nRBC: 0 % (ref 0.0–0.2)

## 2023-11-16 LAB — CREATININE, SERUM
Creatinine, Ser: 0.91 mg/dL (ref 0.44–1.00)
GFR, Estimated: >60 mL/min (ref >60)

## 2023-11-16 LAB — TYPE AND SCREEN
ABO/RH(D): B NEG
Antibody Screen: NEGATIVE

## 2023-11-16 LAB — ABO/RH: ABO/RH(D): B NEG

## 2023-11-16 LAB — COMPREHENSIVE METABOLIC PANEL WITH GFR
ALT: 23 U/L (ref 0–44)
AST: 24 U/L (ref 15–41)
Albumin: 3.3 g/dL — ABNORMAL LOW (ref 3.5–5.0)
Alkaline Phosphatase: 57 U/L (ref 38–126)
Anion gap: 11 (ref 5–15)
BUN: 13 mg/dL (ref 6–20)
CO2: 22 mmol/L (ref 22–32)
Calcium: 9.3 mg/dL (ref 8.9–10.3)
Chloride: 105 mmol/L (ref 98–111)
Creatinine, Ser: 0.68 mg/dL (ref 0.44–1.00)
GFR, Estimated: >60 mL/min (ref >60)
Glucose, Bld: 84 mg/dL (ref 70–99)
Potassium: 3.8 mmol/L (ref 3.5–5.1)
Sodium: 138 mmol/L (ref 135–145)
Total Bilirubin: 0.9 mg/dL (ref 0.0–1.2)
Total Protein: 6.4 g/dL — ABNORMAL LOW (ref 6.5–8.1)

## 2023-11-16 LAB — APTT: aPTT: 28 s (ref 24–36)

## 2023-11-16 LAB — PROTIME-INR
INR: 0.9 (ref 0.8–1.2)
Prothrombin Time: 12.6 s (ref 11.4–15.2)

## 2023-11-16 LAB — HIV ANTIBODY (ROUTINE TESTING W REFLEX): HIV Screen 4th Generation wRfx: NONREACTIVE

## 2023-11-16 SURGERY — EXCISION, RIB
Anesthesia: General | Laterality: Right

## 2023-11-16 MED ORDER — MIDAZOLAM HCL 2 MG/2ML IJ SOLN
INTRAMUSCULAR | Status: AC
Start: 2023-11-16 — End: 2023-11-16
  Filled 2023-11-16: qty 2

## 2023-11-16 MED ORDER — OXYCODONE HCL 5 MG PO TABS: 5.0000 mg | ORAL_TABLET | Freq: Once | ORAL | Status: DC | PRN

## 2023-11-16 MED ORDER — PANTOPRAZOLE SODIUM 40 MG PO TBEC
40.0000 mg | DELAYED_RELEASE_TABLET | Freq: Every day | ORAL | Status: DC
  Administered 2023-11-16 – 2023-11-17 (×2): 40 mg via ORAL
  Filled 2023-11-16 (×2): qty 1

## 2023-11-16 MED ORDER — ONDANSETRON HCL 4 MG/2ML IJ SOLN
INTRAMUSCULAR | Status: DC | PRN
  Administered 2023-11-16: 4 mg via INTRAVENOUS

## 2023-11-16 MED ORDER — VITAMIN B-12 1000 MCG PO TABS
1000.0000 ug | ORAL_TABLET | Freq: Every day | ORAL | Status: DC
  Administered 2023-11-16 – 2023-11-17 (×2): 1000 ug via ORAL
  Filled 2023-11-16 (×2): qty 1

## 2023-11-16 MED ORDER — PROPOFOL 1000 MG/100ML IV EMUL
INTRAVENOUS | Status: AC
Start: 2023-11-16 — End: 2023-11-16
  Filled 2023-11-16: qty 100

## 2023-11-16 MED ORDER — PROGESTERONE 200 MG PO CAPS
200.0000 mg | ORAL_CAPSULE | Freq: Every day | ORAL | Status: DC
  Administered 2023-11-16: 200 mg via ORAL
  Filled 2023-11-16: qty 1

## 2023-11-16 MED ORDER — OXYCODONE HCL 5 MG/5ML PO SOLN: 5.0000 mg | Freq: Once | ORAL | Status: DC | PRN

## 2023-11-16 MED ORDER — KETAMINE HCL 10 MG/ML IJ SOLN
INTRAMUSCULAR | Status: DC | PRN
  Administered 2023-11-16: 10 mg via INTRAVENOUS
  Administered 2023-11-16: 30 mg via INTRAVENOUS

## 2023-11-16 MED ORDER — TRAZODONE HCL 50 MG PO TABS
50.0000 mg | ORAL_TABLET | Freq: Every day | ORAL | Status: DC
  Administered 2023-11-16: 50 mg via ORAL
  Filled 2023-11-16: qty 1

## 2023-11-16 MED ORDER — ACETAMINOPHEN 10 MG/ML IV SOLN: 1000.0000 mg | Freq: Once | INTRAVENOUS | Status: DC | PRN

## 2023-11-16 MED ORDER — CEFAZOLIN SODIUM-DEXTROSE 2-4 GM/100ML-% IV SOLN
2.0000 g | Freq: Three times a day (TID) | INTRAVENOUS | Status: AC
  Administered 2023-11-16 – 2023-11-17 (×2): 2 g via INTRAVENOUS
  Filled 2023-11-16 (×2): qty 100

## 2023-11-16 MED ORDER — POLYETHYLENE GLYCOL 3350 17 G PO PACK: 17.0000 g | PACK | Freq: Every day | ORAL | Status: DC | PRN

## 2023-11-16 MED ORDER — CHLORHEXIDINE GLUCONATE CLOTH 2 % EX PADS: 6.0000 | MEDICATED_PAD | Freq: Once | CUTANEOUS | Status: DC

## 2023-11-16 MED ORDER — FENTANYL CITRATE (PF) 100 MCG/2ML IJ SOLN: 25.0000 ug | INTRAMUSCULAR | Status: DC | PRN

## 2023-11-16 MED ORDER — SUGAMMADEX SODIUM 200 MG/2ML IV SOLN
INTRAVENOUS | Status: DC | PRN
Start: 2023-11-16 — End: 2023-11-16
  Administered 2023-11-16: 200 mg via INTRAVENOUS

## 2023-11-16 MED ORDER — GUAIFENESIN-DM 100-10 MG/5ML PO SYRP: 15.0000 mL | ORAL_SOLUTION | ORAL | Status: DC | PRN

## 2023-11-16 MED ORDER — AMPHETAMINE-DEXTROAMPHETAMINE 10 MG PO TABS
10.0000 mg | ORAL_TABLET | Freq: Every evening | ORAL | Status: DC
  Filled 2023-11-16: qty 1

## 2023-11-16 MED ORDER — CEFAZOLIN SODIUM-DEXTROSE 2-4 GM/100ML-% IV SOLN
2.0000 g | INTRAVENOUS | Status: AC
  Administered 2023-11-16: 2 g via INTRAVENOUS

## 2023-11-16 MED ORDER — PHENOL 1.4 % MT LIQD: 1.0000 | OROMUCOSAL | Status: DC | PRN

## 2023-11-16 MED ORDER — HEPARIN 6000 UNIT IRRIGATION SOLUTION
Status: DC | PRN
  Administered 2023-11-16: 1

## 2023-11-16 MED ORDER — LIOTHYRONINE SODIUM 5 MCG PO TABS
10.0000 ug | ORAL_TABLET | Freq: Two times a day (BID) | ORAL | Status: DC
  Administered 2023-11-17: 10 ug via ORAL
  Filled 2023-11-16 (×3): qty 2

## 2023-11-16 MED ORDER — ORAL CARE MOUTH RINSE
15.0000 mL | Freq: Once | OROMUCOSAL | Status: AC
Start: 2023-11-16 — End: 2023-11-16

## 2023-11-16 MED ORDER — 0.9 % SODIUM CHLORIDE (POUR BTL) OPTIME
TOPICAL | Status: DC | PRN
  Administered 2023-11-16: 1000 mL

## 2023-11-16 MED ORDER — CHLORHEXIDINE GLUCONATE 0.12 % MT SOLN
15.0000 mL | Freq: Once | OROMUCOSAL | Status: AC
  Administered 2023-11-16: 15 mL via OROMUCOSAL

## 2023-11-16 MED ORDER — LIDOCAINE HCL (CARDIAC) PF 100 MG/5ML IV SOSY
PREFILLED_SYRINGE | INTRAVENOUS | Status: DC | PRN
  Administered 2023-11-16: 50 mg via INTRATRACHEAL
  Administered 2023-11-16: 60 mg via INTRATRACHEAL

## 2023-11-16 MED ORDER — METOPROLOL TARTRATE 5 MG/5ML IV SOLN: 2.0000 mg | INTRAVENOUS | Status: DC | PRN

## 2023-11-16 MED ORDER — FENTANYL CITRATE (PF) 250 MCG/5ML IJ SOLN
INTRAMUSCULAR | Status: DC | PRN
  Administered 2023-11-16: 100 ug via INTRAVENOUS
  Administered 2023-11-16 (×3): 50 ug via INTRAVENOUS

## 2023-11-16 MED ORDER — PROPOFOL 10 MG/ML IV BOLUS
INTRAVENOUS | Status: AC
  Filled 2023-11-16: qty 20

## 2023-11-16 MED ORDER — LABETALOL HCL 5 MG/ML IV SOLN: 10.0000 mg | INTRAVENOUS | Status: DC | PRN

## 2023-11-16 MED ORDER — TIZANIDINE HCL 4 MG PO TABS: 4.0000 mg | ORAL_TABLET | Freq: Every evening | ORAL | Status: DC | PRN

## 2023-11-16 MED ORDER — SODIUM CHLORIDE 0.9 % IV SOLN: INTRAVENOUS | Status: DC

## 2023-11-16 MED ORDER — ALUM & MAG HYDROXIDE-SIMETH 200-200-20 MG/5ML PO SUSP: 15.0000 mL | ORAL | Status: DC | PRN

## 2023-11-16 MED ORDER — VITAMIN C 500 MG PO TABS
500.0000 mg | ORAL_TABLET | Freq: Every day | ORAL | Status: DC
  Administered 2023-11-16 – 2023-11-17 (×2): 500 mg via ORAL
  Filled 2023-11-16 (×2): qty 1

## 2023-11-16 MED ORDER — ONDANSETRON HCL 4 MG/2ML IJ SOLN: 4.0000 mg | Freq: Four times a day (QID) | INTRAMUSCULAR | Status: DC | PRN

## 2023-11-16 MED ORDER — PROPOFOL 10 MG/ML IV BOLUS
INTRAVENOUS | Status: AC
Start: 2023-11-16 — End: 2023-11-16
  Filled 2023-11-16: qty 20

## 2023-11-16 MED ORDER — OXYCODONE-ACETAMINOPHEN 5-325 MG PO TABS
1.0000 | ORAL_TABLET | ORAL | Status: DC | PRN
  Administered 2023-11-16 (×2): 1 via ORAL
  Administered 2023-11-17: 2 via ORAL
  Administered 2023-11-17 (×2): 1 via ORAL
  Filled 2023-11-16 (×2): qty 2
  Filled 2023-11-16 (×3): qty 1

## 2023-11-16 MED ORDER — HYDRALAZINE HCL 20 MG/ML IJ SOLN: 5.0000 mg | INTRAMUSCULAR | Status: DC | PRN

## 2023-11-16 MED ORDER — SODIUM CHLORIDE 0.9 % IV SOLN
INTRAVENOUS | Status: DC
Start: 2023-11-16 — End: 2023-11-17

## 2023-11-16 MED ORDER — FENTANYL CITRATE (PF) 250 MCG/5ML IJ SOLN
INTRAMUSCULAR | Status: AC
  Filled 2023-11-16: qty 5

## 2023-11-16 MED ORDER — ROCURONIUM BROMIDE 100 MG/10ML IV SOLN
INTRAVENOUS | Status: DC | PRN
Start: 2023-11-16 — End: 2023-11-16
  Administered 2023-11-16: 60 mg via INTRAVENOUS
  Administered 2023-11-16 (×3): 20 mg via INTRAVENOUS

## 2023-11-16 MED ORDER — HYDROCORTISONE 10 MG PO TABS
10.0000 mg | ORAL_TABLET | Freq: Every morning | ORAL | Status: DC
  Administered 2023-11-17: 10 mg via ORAL
  Filled 2023-11-16: qty 1

## 2023-11-16 MED ORDER — MIDAZOLAM HCL 2 MG/2ML IJ SOLN
INTRAMUSCULAR | Status: DC | PRN
  Administered 2023-11-16: 2 mg via INTRAVENOUS

## 2023-11-16 MED ORDER — POTASSIUM CHLORIDE CRYS ER 20 MEQ PO TBCR
20.0000 meq | EXTENDED_RELEASE_TABLET | Freq: Once | ORAL | Status: AC
  Administered 2023-11-16: 20 meq via ORAL
  Filled 2023-11-16: qty 2

## 2023-11-16 MED ORDER — ACETAMINOPHEN 500 MG PO TABS
1000.0000 mg | ORAL_TABLET | Freq: Once | ORAL | Status: AC
  Administered 2023-11-16: 1000 mg via ORAL
  Filled 2023-11-16: qty 2

## 2023-11-16 MED ORDER — BISACODYL 10 MG RE SUPP: 10.0000 mg | Freq: Every day | RECTAL | Status: DC | PRN

## 2023-11-16 MED ORDER — PRASTERONE (DHEA) 10 MG PO CAPS: 30.0000 mg | ORAL_CAPSULE | Freq: Every day | ORAL | Status: DC

## 2023-11-16 MED ORDER — ACETAMINOPHEN 650 MG RE SUPP: 325.0000 mg | RECTAL | Status: DC | PRN

## 2023-11-16 MED ORDER — DEXAMETHASONE SODIUM PHOSPHATE 10 MG/ML IJ SOLN
INTRAMUSCULAR | Status: DC | PRN
  Administered 2023-11-16: 4 mg via INTRAVENOUS

## 2023-11-16 MED ORDER — KETAMINE HCL 50 MG/5ML IJ SOSY
PREFILLED_SYRINGE | INTRAMUSCULAR | Status: AC
  Filled 2023-11-16: qty 5

## 2023-11-16 MED ORDER — MAGNESIUM SULFATE 50 % IJ SOLN
INTRAMUSCULAR | Status: DC | PRN
Start: 2023-11-16 — End: 2023-11-16
  Administered 2023-11-16: 2 g via INTRAVENOUS

## 2023-11-16 MED ORDER — HEPARIN SODIUM (PORCINE) 5000 UNIT/ML IJ SOLN
5000.0000 [IU] | Freq: Three times a day (TID) | INTRAMUSCULAR | Status: DC
  Administered 2023-11-17: 5000 [IU] via SUBCUTANEOUS
  Filled 2023-11-16: qty 1

## 2023-11-16 MED ORDER — LACTATED RINGERS IV SOLN: INTRAVENOUS | Status: DC

## 2023-11-16 MED ORDER — HEPARIN 6000 UNIT IRRIGATION SOLUTION
Status: AC
  Filled 2023-11-16: qty 500

## 2023-11-16 MED ORDER — MORPHINE SULFATE (PF) 2 MG/ML IV SOLN: 2.0000 mg | INTRAVENOUS | Status: DC | PRN

## 2023-11-16 MED ORDER — PROPOFOL 10 MG/ML IV BOLUS
INTRAVENOUS | Status: DC | PRN
  Administered 2023-11-16: 40 mg via INTRAVENOUS
  Administered 2023-11-16: 30 mg via INTRAVENOUS
  Administered 2023-11-16: 110 mg via INTRAVENOUS
  Administered 2023-11-16: 175 ug/kg/min via INTRAVENOUS

## 2023-11-16 MED ORDER — THYROID 60 MG PO TABS
120.0000 mg | ORAL_TABLET | Freq: Every morning | ORAL | Status: DC
  Administered 2023-11-17: 120 mg via ORAL
  Filled 2023-11-16: qty 2

## 2023-11-16 MED ORDER — SODIUM CHLORIDE (PF) 0.9 % IJ SOLN
INTRAVENOUS | Status: DC | PRN
  Administered 2023-11-16: 20 mL via INTRAMUSCULAR

## 2023-11-16 MED ORDER — ACETAMINOPHEN 325 MG PO TABS: 325.0000 mg | ORAL_TABLET | ORAL | Status: DC | PRN

## 2023-11-16 SURGICAL SUPPLY — 54 items
BAG COUNTER SPONGE SURGICOUNT (BAG) ×2 IMPLANT
BALLOON MUSTANG 10X80X75 (BALLOONS) IMPLANT
BALLOON MUSTANG 8X60X75 (BALLOONS) IMPLANT
BNDG COHESIVE 4X5 TAN STRL LF (GAUZE/BANDAGES/DRESSINGS) ×2 IMPLANT
BNDG COHESIVE 6X5 TAN NS LF (GAUZE/BANDAGES/DRESSINGS) ×2 IMPLANT
CANISTER SUCTION 3000ML PPV (SUCTIONS) ×2 IMPLANT
CATH BEACON 5 .035 65 KMP TIP (CATHETERS) IMPLANT
CATH VISIONS PV .035 IVUS (CATHETERS) IMPLANT
CLIP LIGATING EXTRA MED SLVR (CLIP) ×2 IMPLANT
CLIP LIGATING EXTRA SM BLUE (MISCELLANEOUS) ×2 IMPLANT
CNTNR URN SCR LID CUP LEK RST (MISCELLANEOUS) ×2 IMPLANT
COVER MAYO STAND STRL (DRAPES) IMPLANT
COVER SURGICAL LIGHT HANDLE (MISCELLANEOUS) IMPLANT
DERMABOND ADVANCED .7 DNX12 (GAUZE/BANDAGES/DRESSINGS) ×4 IMPLANT
DRAPE C-ARM 42X72 X-RAY (DRAPES) IMPLANT
DRAPE INCISE IOBAN 66X45 STRL (DRAPES) ×2 IMPLANT
GLIDEWIRE ADV .035X180CM (WIRE) IMPLANT
GLIDEWIRE ADV .035X260CM (WIRE) IMPLANT
GLOVE BIO SURGEON STRL SZ7.5 (GLOVE) ×2 IMPLANT
GOWN STRL REUS W/ TWL LRG LVL3 (GOWN DISPOSABLE) ×4 IMPLANT
GOWN STRL REUS W/ TWL XL LVL3 (GOWN DISPOSABLE) ×2 IMPLANT
GUIDEWIRE BENTSON (WIRE) IMPLANT
HEMOSTAT HEMOSTS OSTN BONE 2.5 (HEMOSTASIS) IMPLANT
KIT BASIN OR (CUSTOM PROCEDURE TRAY) ×2 IMPLANT
KIT ENCORE 26 ADVANTAGE (KITS) IMPLANT
NDL HYPO 25GX1X1/2 BEV (NEEDLE) ×2 IMPLANT
NEEDLE HYPO 25GX1X1/2 BEV (NEEDLE) ×2 IMPLANT
NS IRRIG 1000ML POUR BTL (IV SOLUTION) ×2 IMPLANT
PACK CV ACCESS (CUSTOM PROCEDURE TRAY) ×2 IMPLANT
PACK ENDO MINOR (CUSTOM PROCEDURE TRAY) ×2 IMPLANT
PACK UNIVERSAL I (CUSTOM PROCEDURE TRAY) ×2 IMPLANT
PAD ARMBOARD POSITIONER FOAM (MISCELLANEOUS) ×4 IMPLANT
POWDER SURGICEL 3.0 GRAM (HEMOSTASIS) IMPLANT
SET MICROPUNCTURE 5F STIFF (MISCELLANEOUS) IMPLANT
SHEATH PINNACLE 5F 10CM (SHEATH) IMPLANT
SHEATH PINNACLE 6F 10CM (SHEATH) IMPLANT
SLING ARM FOAM STRAP MED (SOFTGOODS) IMPLANT
SPONGE INTESTINAL PEANUT (DISPOSABLE) ×2 IMPLANT
SPONGE T-LAP 18X18 ~~LOC~~+RFID (SPONGE) ×2 IMPLANT
STOCKINETTE 3IN STRL (GAUZE/BANDAGES/DRESSINGS) IMPLANT
STOCKINETTE 6 STRL (DRAPES) IMPLANT
STOCKINETTE IMPERVIOUS LG (DRAPES) ×2 IMPLANT
SUT MNCRL AB 4-0 PS2 18 (SUTURE) ×2 IMPLANT
SUT VIC AB 2-0 CT1 TAPERPNT 27 (SUTURE) ×2 IMPLANT
SUT VIC AB 3-0 SH 27X BRD (SUTURE) ×2 IMPLANT
SYR 10ML LL (SYRINGE) IMPLANT
SYR 20ML LL LF (SYRINGE) IMPLANT
SYR CONTROL 10ML LL (SYRINGE) ×2 IMPLANT
TOWEL GREEN STERILE (TOWEL DISPOSABLE) ×4 IMPLANT
TOWEL GREEN STERILE FF (TOWEL DISPOSABLE) IMPLANT
TUBING CIL FLEX 10 FLL-RA (TUBING) IMPLANT
UNDERPAD 30X36 HEAVY ABSORB (UNDERPADS AND DIAPERS) ×2 IMPLANT
WATER STERILE IRR 1000ML POUR (IV SOLUTION) ×2 IMPLANT
WIRE AMPLATZ SS-J .035X260CM (WIRE) IMPLANT

## 2023-11-16 NOTE — Progress Notes (Signed)
 Orthopedic Tech Progress Note Patient Details:  Carol Jacobs 10/10/1960 [id number]  Per RN, patient has on sling  Patient ID: Carol Jacobs, female   DOB: December 04, 1960, 63 y.o.   MRN: [id number]  [name] Pac 11/16/2023, 5:38 PM

## 2023-11-16 NOTE — Anesthesia Procedure Notes (Signed)
 Procedure Name: Intubation Date/Time: 11/16/2023 12:28 PM  Performed by: Laverda Burnard LABOR, CRNAPre-anesthesia Checklist: Patient identified, Emergency Drugs available, Suction available and Patient being monitored Patient Re-evaluated:Patient Re-evaluated prior to induction Oxygen Delivery Method: Circle system utilized Preoxygenation: Pre-oxygenation with 100% oxygen Induction Type: IV induction Ventilation: Mask ventilation without difficulty Laryngoscope Size: Miller and 3 Grade View: Grade I Tube type: Oral Tube size: 7.0 mm Number of attempts: 1 Airway Equipment and Method: Stylet and Oral airway Placement Confirmation: ETT inserted through vocal cords under direct vision, positive ETCO2 and breath sounds checked- equal and bilateral Secured at: 22 cm Tube secured with: Tape Dental Injury: Teeth and Oropharynx as per pre-operative assessment

## 2023-11-16 NOTE — Progress Notes (Addendum)
 Pt received from PACU, V/S obtained, CCMD notified, CHG bath given, she has +2 pulse of right upper extremity, denies pain, all needs met, call bell in reach.   Shoulder sling applied.   11/16/23 1541  Vitals  Temp 98.2 F (36.8 C)  Temp Source Oral  BP (!) 105/53  MAP (mmHg) 67  BP Location Left Arm  BP Method Automatic  Patient Position (if appropriate) Lying  Pulse Rate 70  Pulse Rate Source Monitor  ECG Heart Rate 73  Resp 17  Level of Consciousness  Level of Consciousness Alert  MEWS COLOR  MEWS Score Color Green  Oxygen Therapy  SpO2 98 %  O2 Device Room Air  Pain Assessment  Pain Scale 0-10  Pain Score 0  MEWS Score  MEWS Temp 0  MEWS Systolic 0  MEWS Pulse 0  MEWS RR 0  MEWS LOC 0  MEWS Score 0

## 2023-11-16 NOTE — Op Note (Signed)
 Patient name: Carol Jacobs MRN: [id number] DOB: June 11, 1960 Sex: female  11/16/2023 Pre-operative Diagnosis: Right sided venous thoracic outlet syndrome Post-operative diagnosis:  Same Surgeon:  Penne C. Sheree, MD Assistant: Malvina New, MD; Lucie Apt, PA Procedure Performed: 1.  Transaxillary right first rib resection 2.  Ultrasound-guided cannulation right basilic vein 3.  Right upper extremity venogram 4.  Balloon venoplasty right subclavian vein with 8 and 10 mm Mustang balloons  Indications: 63 year old female with history of extensive right upper extremity DVT found to have occlusion of her subclavian vein at the thoracic outlet consistent with Paget Schroeder syndrome.  She has undergone mechanical thrombectomy and has remained on anticoagulation and now presents for first rib resection and right upper extremity venogram.  Findings: First rib was fully removed there was no evidence of pneumothorax and the wound was primarily closed.  On venography the subclavian vein had reoccluded this was opened with 8 and 10 mm balloons and at completion there was no evidence of collateralization and brisk flow centrally.   Procedure:  The patient was identified in the holding area and taken to the operating room where she was placed upon operative table and general anesthesia was induced.  She was then placed in the left lateral decubitus position sterilely prepped and draped in the right upper extremity and axilla in the usual fashion, antibiotics were administered a timeout was called.  An incision was created at the hairbearing line between the latissimus dorsi and pectoralis muscles.  I dissected directly down onto the chest wall.  I then bluntly mobilized the soft tissue along the chest wall.  The intercostal brachial nerve was identified and protected.  There were a few crossing veins that were divided between ties.  Identified the first rib with the anterior scalene muscle attached.   The inferior border the first rib was then dissected free of connective tissue and muscle with Cobb elevator.  The rib was then elevated from the underlying parietal pleura.  The middle scalene muscle was then bluntly removed from the cephalad aspect of the rib using [facility].  The anterior scalene was then encircled with right angle and transected 2 cm above its attachment to the first rib using scissors.  I also sharply divided some of the subclavius muscle anteriorly.  The rib was then transected anteriorly and then I removed and 2 segments moving posteriorly in the brachial plexus was retracted out of the way during this transection.  I then smooth the anterior and posterior remaining segments with a rasp.  I then sharply used scissors to dissected free the connective tissue scarring around the subclavian vein and this was significantly then freed up.  We then filled the axilla with saline there was no drainage of the saline and we performed Valsalva and there was no bubbling.  Saline was all removed.  Pressure was held until hemostasis was obtained.  We then closed the subcutaneous tissue with Vicryl and the skin was closed with Monocryl and Dermabond is placed at the skin site.  The patient was then taken back to the supine position to sterilely prepped and draped in the right upper extremity and a new timeout was called.  Identified a basilic vein using ultrasound this was cannulated with a micropuncture needle followed by wire and a sheath.  Upper extremity venography was performed with evidence of occlusion of the subclavian vein.  I then placed a 6 French sheath over a Glidewire advantage.  I was then able to cross  centrally into the right atrium with Kumpe catheter and I confirmed intraluminal access.  I then primarily ballooned the subclavian vein with 8 and 10 mm Mustang balloons at nominal pressure for 2 minutes.  I did not administer heparin  given that the patient had surgery in  the axilla.  Completion demonstrated patency of the subclavian vein with no collateralization and for that reason I elected not to place a stent.  I then removed the wire and the sheath was removed and the cannulation site was suture ligated with 4-0 Monocryl.  Dermabond was again placed at the site.  She was then awakened from anesthesia having tolerated the procedure well without immediate complication.  Chest x-ray will be checked in the PACU.  All counts were correct at completion.  EBL: 50 cc  Contrast: 30 cc   Ollie Esty C. Sheree, MD [facility] [place] Office: [phone] Pager: [phone]

## 2023-11-16 NOTE — Anesthesia Postprocedure Evaluation (Signed)
 Anesthesia Post Note  Patient: Carol Jacobs  Procedure(s) Performed: EXCISION, RIB (Right) VENOGRAM     Patient location during evaluation: PACU Anesthesia Type: General Level of consciousness: awake and alert Pain management: pain level controlled Vital Signs Assessment: post-procedure vital signs reviewed and stable Respiratory status: spontaneous breathing, nonlabored ventilation, respiratory function stable and patient connected to nasal cannula oxygen Cardiovascular status: blood pressure returned to baseline and stable Postop Assessment: no apparent nausea or vomiting Anesthetic complications: no   No notable events documented.  Last Vitals:  Vitals:   11/16/23 1515 11/16/23 1541  BP: 111/60 (!) 105/53  Pulse: 71 70  Resp: 13 17  Temp:  36.8 C  SpO2: 96% 98%    Last Pain:  Vitals:   11/16/23 1541  TempSrc: Oral  PainSc: 0-No pain                 Carol Jacobs

## 2023-11-16 NOTE — Plan of Care (Signed)
  Problem: Activity: Goal: Risk for activity intolerance will decrease Outcome: Progressing   Problem: Nutrition: Goal: Adequate nutrition will be maintained Outcome: Progressing   Problem: Clinical Measurements: Goal: Ability to maintain clinical measurements within normal limits will improve Outcome: Progressing Goal: Will remain free from infection Outcome: Progressing Goal: Diagnostic test results will improve Outcome: Progressing Goal: Respiratory complications will improve Outcome: Progressing Goal: Cardiovascular complication will be avoided Outcome: Progressing   Problem: Elimination: Goal: Will not experience complications related to bowel motility Outcome: Progressing Goal: Will not experience complications related to urinary retention Outcome: Progressing   Problem: Pain Managment: Goal: General experience of comfort will improve and/or be controlled Outcome: Progressing   Problem: Safety: Goal: Ability to remain free from injury will improve Outcome: Progressing

## 2023-11-16 NOTE — Anesthesia Preprocedure Evaluation (Addendum)
 Anesthesia Evaluation  Patient identified by MRN, date of birth, ID band Patient awake    Reviewed: Allergy & Precautions, NPO status , Patient's Chart, lab work & pertinent test results  History of Anesthesia Complications (+) PONV and history of anesthetic complications  Airway Mallampati: I  TM Distance: >3 FB Neck ROM: Full    Dental  (+) Teeth Intact, Dental Advisory Given   Pulmonary neg pulmonary ROS   breath sounds clear to auscultation       Cardiovascular + Peripheral Vascular Disease (paget-schroetter syndrome)   Rhythm:Regular     Neuro/Psych  Neuromuscular disease    GI/Hepatic negative GI ROS, Neg liver ROS,,,  Endo/Other  Hypothyroidism    Renal/GU negative Renal ROS     Musculoskeletal  (+) Arthritis ,    Abdominal   Peds  (+) ATTENTION DEFICIT DISORDER WITHOUT HYPERACTIVITY Hematology  (+) Blood dyscrasia (Eliquis ) Lab Results      Component                Value               Date                      WBC                      5.6                 11/16/2023                HGB                      14.4                11/16/2023                HCT                      44.7                11/16/2023                MCV                      88.7                11/16/2023                PLT                      222                 11/16/2023          \    Anesthesia Other Findings Day of surgery medications reviewed with the patient.  Reproductive/Obstetrics                             Anesthesia Physical Anesthesia Plan  ASA: 2  Anesthesia Plan: General   Post-op Pain Management: Tylenol  PO (pre-op)*   Induction: Intravenous  PONV Risk Score and Plan: 4 or greater and Midazolam , Dexamethasone , Ondansetron , Propofol  infusion and TIVA  Airway Management Planned: Oral ETT  Additional Equipment:   Intra-op Plan:   Post-operative Plan: Extubation in OR  Informed  Consent: I have reviewed the patients History and Physical, chart, labs and  discussed the procedure including the risks, benefits and alternatives for the proposed anesthesia with the patient or authorized representative who has indicated his/her understanding and acceptance.     Dental advisory given  Plan Discussed with: CRNA  Anesthesia Plan Comments: (2nd PIV after induction )       Anesthesia Quick Evaluation

## 2023-11-16 NOTE — H&P (Signed)
 H+P  History of Present Illness: This is a 63 y.o. female without significant vascular history does have history of injury to her neck and shoulder area.  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  She is now indicated for first rib resection.  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   PONV (postoperative nausea and vomiting)     Past Surgical History:  Procedure Laterality Date   ABDOMINAL HYSTERECTOMY     ARTHROSCOPIC REPAIR ACL Left    EYE SURGERY Right    retina tear   JOINT REPLACEMENT Right 2019   Hip replacement - [facility]   ORIF CALCANEOUS FRACTURE Right 05/11/2014   Procedure: OPEN REDUCTION INTERNAL FIXATION (ORIF) CALCANEOUS FRACTURE;  Surgeon: Jerona Harden GAILS, MD;  Location: [facility] OR;  Service: Orthopedics;  Laterality: Right;   PERIPHERAL VASCULAR THROMBECTOMY Right 11/08/2023   Procedure: PERIPHERAL VASCULAR THROMBECTOMY;  Surgeon: Sheree Penne Bruckner, MD;  Location: [facility] INVASIVE CV LAB;  Service: Cardiovascular;  Laterality: Right;   shoulder sx     UPPER EXTREMITY VENOGRAPHY Right 11/08/2023   Procedure: UPPER EXTREMITY VENOGRAPHY;  Surgeon: Sheree Penne Bruckner, MD;  Location: [facility] INVASIVE CV LAB;  Service: Cardiovascular;  Laterality: Right;   WISDOM TOOTH EXTRACTION      Allergies  Allergen Reactions   Nitrofurantoin Shortness Of Breath, Nausea Only and Other (See Comments)    Dysphoria  Flushing  GI Upset   Ciprofloxacin      Dizziness, chills     Prior to Admission medications   Medication Sig Start Date End Date Taking? Authorizing Provider  amphetamine-dextroamphetamine (ADDERALL) 10 MG tablet Take 10 mg by mouth every evening. 09/02/23   [provider]  apixaban  (ELIQUIS ) 5 MG TABS tablet Take 1 tablet (5 mg total) by mouth 2 (two) times daily. 10/27/23   Sheree Penne Bruckner, MD  ARMOUR THYROID 120 MG tablet Take 120 mg by mouth every morning.    [provider]  ascorbic acid (VITAMIN C) 500 MG tablet Take 500 mg by mouth daily. 07/05/19   [provider]  Cholecalciferol (VITAMIN D3) LIQD Take 4-5 drops by mouth daily. 6000-8000 units    [provider]  cyanocobalamin (VITAMIN B12) 1000 MCG tablet Take 1,000 mcg by mouth daily.    [provider]  Elderberry-Vitamin C-Zinc (ELDERBERRY IMMUNE HEALTH GUMMY PO) Take 1 each by mouth daily.    [provider]  hydrocortisone (CORTEF) 5 MG tablet Take 10 mg by mouth every morning.    [provider]  liothyronine (CYTOMEL) 5 MCG tablet Take 10 mcg by mouth 2 (two) times daily.    [provider]  MAGNESIUM  PO Take 900 mg by mouth every evening.    [provider]  Multiple Vitamins-Calcium (ONE-A-DAY WOMENS PO) Take 1 tablet by mouth daily.    [provider]  Omega-3 Fatty Acids (FISH OIL PO) Take 1 capsule by mouth  daily.    [provider]  Prasterone, DHEA, 10 MG CAPS Take 30 mg by mouth daily.    [provider]  Probiotic Product (PROBIOTIC DAILY PO) Take 1 capsule by mouth daily.    [provider]  progesterone (PROMETRIUM) 200 MG capsule Take 200 mg by mouth at bedtime.    [provider]  tiZANidine (ZANAFLEX) 4 MG tablet Take 4 mg by mouth at bedtime as needed for muscle spasms.    [provider]  traZODone (DESYREL) 50 MG tablet Take 50 mg by mouth at bedtime.    [provider]  tretinoin (RETIN-A) 0.05 %  cream Apply 1 application  topically at bedtime.    [provider]  Zinc 30 MG TABS Take 30 mg by mouth daily.    [provider]    Social History   Socioeconomic History   Marital status: Married    Spouse name: Not on file   Number of children: Not on file   Years of education: Not on file   Highest education level: Not on file  Occupational History   Not on file  Tobacco Use   Smoking status: Never   Smokeless tobacco: Never  Vaping Use   Vaping status: Never Used  Substance and Sexual Activity   Alcohol use: No   Drug use: No   Sexual activity: Yes    Birth control/protection: Surgical    Comment: Hysterectomy  Other Topics Concern   Not on file  Social History Narrative   Not on file   Social Drivers of Health   Financial Resource Strain: Low Risk  (09/06/2023)   Received from [facility]   Overall Financial Resource Strain (CARDIA)    Difficulty of Paying Living Expenses: Not very hard  Food Insecurity: Low Risk  (10/25/2023)   Received from [facility]   Hunger Vital Sign    Within the past 12 months, you worried that your food would run out before you got money to buy more: Never true    Within the past 12 months, the food you bought just didn't last and you didn't have money to get more. : Never true  Transportation Needs: No Transportation Needs (10/25/2023)   Received from [facility]    In the past 12 months, has lack of reliable transportation kept you from medical appointments, meetings, work or from getting things needed for daily living? : No  Physical Activity: Insufficiently Active (09/06/2023)   Received from [facility]   Exercise Vital Sign    On average, how many days per week do you engage in moderate to strenuous exercise (like a brisk walk)?: 3 days    On average, how many minutes do you engage in exercise at this level?: 30 min  Stress: No Stress Concern Present (09/06/2023)   Received from [facility] - Occupational Stress Questionnaire    Feeling of Stress : Not at all  Social Connections: Socially Integrated (09/06/2023)   Received from [facility]   Social Network    How would you rate your social network (family, work, friends)?: Good participation with social networks  Intimate Partner Violence: Not At Risk (09/06/2023)   Received from [facility]   HITS    Over the last 12 months how often did your partner physically hurt you?: Never    Over the last 12 months how often did your partner insult you or talk  down to you?: Never    Over the last 12 months how often did your partner threaten you with physical harm?: Never    Over the last 12 months how often did your partner scream or curse at you?: Never     History reviewed. No pertinent family history.  ROS: Right upper extremity swelling   Physical Examination Vitals:   11/16/23 0943  BP: (!) 113/53  Pulse: 61  Resp: 18  Temp: 98 F (36.7 C)  SpO2: 96%   Aaox3 Non labored respirations RUE edema much improved   CBC    Component Value Date/Time   WBC 7.7 10/02/2023 1119   RBC 5.42 (H) 10/02/2023 1119   HGB 15.3 (H) 11/08/2023 1226   HCT 45.0 11/08/2023 1226   PLT 203 10/02/2023 1119   MCV 86.9 10/02/2023 1119   MCH 28.4 10/02/2023 1119   MCHC 32.7 10/02/2023 1119   RDW 12.4 10/02/2023 1119   LYMPHSABS 1.6 10/02/2023 1119   MONOABS 0.6 10/02/2023 1119   EOSABS 0.1 10/02/2023 1119   BASOSABS 0.0 10/02/2023 1119    BMET    Component Value Date/Time   NA 138 11/08/2023 1226   K 4.2 11/08/2023 1226   CL 101 11/08/2023 1226   CO2 27 10/02/2023 1150   GLUCOSE 92 11/08/2023 1226   BUN 14 11/08/2023 1226   CREATININE 0.80 11/08/2023 1226   CALCIUM 9.6 10/02/2023 1150   GFRNONAA >60 10/02/2023 1150    COAGS: Lab Results  Component Value Date   INR 1.0 10/02/2023     Vascular Imaging:    Recent right upper extremity venography  reviewed  ASSESSMENT/PLAN: This is a 63 y.o. female with Paget Schroeder syndrome status post venography with mechanical thrombectomy in the right upper extremity now with much improvement in edema.  Plan for right first rib resection today with repeat venography.  Risk benefits alternatives discussed with the patient and her husband including nerve injury, pneumothorax requiring chest tube, persistent pain, numbness of the upper medial arm and wound healing.  They demonstrate good understanding and agreed to proceed.     Baptiste Littler C. Sheree, MD [facility] [place] Office: [phone] Pager: [phone]

## 2023-11-16 NOTE — Transfer of Care (Signed)
 Immediate Anesthesia Transfer of Care Note  Patient: Carol Jacobs  Procedure(s) Performed: EXCISION, RIB (Right) VENOGRAM  Patient Location: PACU  Anesthesia Type:General  Level of Consciousness: drowsy and patient cooperative  Airway & Oxygen Therapy: Patient Spontanous Breathing and Patient connected to face mask oxygen  Post-op Assessment: Report given to RN and Post -op Vital signs reviewed and stable  Post vital signs: Reviewed and stable  Last Vitals:  Vitals Value Taken Time  BP 108/52 11/16/23 14:40  Temp    Pulse 79 11/16/23 14:43  Resp 17 11/16/23 14:43  SpO2 100 % 11/16/23 14:43  Vitals shown include unfiled device data.  Last Pain:  Vitals:   11/16/23 1015  TempSrc:   PainSc: 0-No pain         Complications: No notable events documented.

## 2023-11-17 ENCOUNTER — Encounter (HOSPITAL_COMMUNITY): Payer: Self-pay | Admitting: Vascular Surgery

## 2023-11-17 LAB — CBC
HCT: 40.7 % (ref 36.0–46.0)
Hemoglobin: 13.2 g/dL (ref 12.0–15.0)
MCH: 28.3 pg (ref 26.0–34.0)
MCHC: 32.4 g/dL (ref 30.0–36.0)
MCV: 87.2 fL (ref 80.0–100.0)
Platelets: 244 10*3/uL (ref 150–400)
RBC: 4.67 MIL/uL (ref 3.87–5.11)
RDW: 12.5 % (ref 11.5–15.5)
WBC: 13.6 10*3/uL — ABNORMAL HIGH (ref 4.0–10.5)
nRBC: 0 % (ref 0.0–0.2)

## 2023-11-17 LAB — BASIC METABOLIC PANEL WITH GFR
Anion gap: 7 (ref 5–15)
BUN: 11 mg/dL (ref 6–20)
CO2: 24 mmol/L (ref 22–32)
Calcium: 8.4 mg/dL — ABNORMAL LOW (ref 8.9–10.3)
Chloride: 103 mmol/L (ref 98–111)
Creatinine, Ser: 0.73 mg/dL (ref 0.44–1.00)
GFR, Estimated: >60 mL/min (ref >60)
Glucose, Bld: 103 mg/dL — ABNORMAL HIGH (ref 70–99)
Potassium: 3.8 mmol/L (ref 3.5–5.1)
Sodium: 134 mmol/L — ABNORMAL LOW (ref 135–145)

## 2023-11-17 MED ORDER — OXYCODONE-ACETAMINOPHEN 5-325 MG PO TABS: 1.0000 | ORAL_TABLET | Freq: Four times a day (QID) | ORAL | 0 refills | Status: AC | PRN

## 2023-11-17 NOTE — TOC Transition Note (Addendum)
 Transition of Care (TOC) - Discharge Note Rayfield Gobble RN, BSN Transitions of Care Unit 4E- RN Case Manager See Treatment Team for direct phone #  Patient Details  Name: Carol Jacobs MRN: [id number] Date of Birth: 1960-11-20  Transition of Care [facility]) CM/SW Contact:  Gobble Rayfield Hurst, RN Phone Number: 11/17/2023, 10:31 AM   Clinical Narrative:    Pt stable for transition home today, Spouse to transport home.  TOC consult received for outpt PT referral.  Pt lives in [place], outpt PT referral made to [facility]- ref# [id number] Info placed on AVS for follow up on Outpt PT.   No DME needs noted.    Final next level of care: OP Rehab Barriers to Discharge: No Barriers Identified   Patient Goals and CMS Choice Patient states their goals for this hospitalization and ongoing recovery are:: return home   Choice offered to / list presented to : NA      Discharge Placement               home        Discharge Plan and Services Additional resources added to the After Visit Summary for     Discharge Planning Services: CM Consult Post Acute Care Choice: NA          DME Arranged: N/A DME Agency: NA       HH Arranged: NA HH Agency: NA        Social Drivers of Health (SDOH) Interventions SDOH Screenings   Food Insecurity: Low Risk  (10/25/2023)   Received from [facility]  Housing: Low Risk  (10/25/2023)   Received from [facility]  Transportation Needs: No Transportation Needs (10/25/2023)   Received from [facility]  Utilities: Low Risk  (10/25/2023)   Received from [facility]  Financial Resource Strain: Low Risk  (09/06/2023)   Received from [facility]  Physical Activity: Insufficiently Active (09/06/2023)   Received from [facility]  Social Connections: Socially Integrated (09/06/2023)   Received from [facility]  Stress: No Stress Concern Present (09/06/2023)   Received from [facility]  Tobacco Use: Low Risk  (11/16/2023)      Readmission Risk Interventions    11/17/2023   10:31 AM  Readmission Risk Prevention Plan  Post Dischage Appt Complete  Medication Screening Complete  Transportation Screening Complete

## 2023-11-17 NOTE — Progress Notes (Addendum)
  Progress Note    11/17/2023 7:45 AM 1 Day Post-Op  Subjective:  no complaints   Vitals:   11/17/23 0507 11/17/23 0640  BP: (!) 90/46 (!) 101/47  Pulse:    Resp: 17 18  Temp:    SpO2:     Physical Exam: Lungs:  non labored Incisions:  R axilla c/d/I without hematoma; access sites also without hematoma Extremities:  no significant edema R arm; palpable R radial Neurologic: A&O  CBC    Component Value Date/Time   WBC 13.6 (H) 11/17/2023 0321   RBC 4.67 11/17/2023 0321   HGB 13.2 11/17/2023 0321   HCT 40.7 11/17/2023 0321   PLT 244 11/17/2023 0321   MCV 87.2 11/17/2023 0321   MCH 28.3 11/17/2023 0321   MCHC 32.4 11/17/2023 0321   RDW 12.5 11/17/2023 0321   LYMPHSABS 1.6 10/02/2023 1119   MONOABS 0.6 10/02/2023 1119   EOSABS 0.1 10/02/2023 1119   BASOSABS 0.0 10/02/2023 1119    BMET    Component Value Date/Time   NA 134 (L) 11/17/2023 0321   K 3.8 11/17/2023 0321   CL 103 11/17/2023 0321   CO2 24 11/17/2023 0321   GLUCOSE 103 (H) 11/17/2023 0321   BUN 11 11/17/2023 0321   CREATININE 0.73 11/17/2023 0321   CALCIUM 8.4 (L) 11/17/2023 0321   GFRNONAA >60 11/17/2023 0321    INR    Component Value Date/Time   INR 0.9 11/16/2023 1015     Intake/Output Summary (Last 24 hours) at 11/17/2023 0745 Last data filed at 11/16/2023 2300 Gross per 24 hour  Intake 2043.71 ml  Output 50 ml  Net 1993.71 ml     Assessment/Plan:  63 y.o. female is s/p R first rib resection 1 Day Post-Op   No significant edema R arm Incision is well appearing without hematoma; access site R arm also without hematoma Lungs: non labored breathing on RA Ok for discharge home; she will be referred to PT She received subcutaneous heparin  this morning; she will resume Eliquis  this evening   Donnice Sender, PA-C [facility] [phone] 11/17/2023 7:45 AM  I have independently interviewed and examined patient and agree with PA assessment and plan above.  She  appears to have full function of her right upper extremity and her hand is sensory and motor intact with normal strength this morning.  We discussed that she does not have any frank limitations other than not submerging the incision in water for approximately 2 weeks and really she should avoid any heavy lifting that hurts the site and she can wear a sling for comfort.  Will refer to physical therapy to ensure that she continues with range of motion exercises and to get her strength back.  She is okay for discharge today we will resume Eliquis  which she will take for a total of 3 months.  She will follow-up in 2 to 3 weeks for a wound check and then I will schedule her at the time of Eliquis  discontinuation with right upper extremity venous duplex.  All questions were answered they demonstrate very good understanding.  Sanuel Ladnier C. Sheree, MD [facility] [place] Office: [phone] Pager: [phone]

## 2023-11-17 NOTE — Progress Notes (Signed)
 Discharge instructions provided to the patient, PIV removed, CCMD notified, educated regarding wound care and ROM exercise, no any DMEs required, had no any concerns during discharge.

## 2023-11-17 NOTE — Evaluation (Signed)
 Physical Therapy Evaluation Patient Details Name: Carol Jacobs MRN: [id number] DOB: May 11, 1961 Today's Date: 11/17/2023  History of Present Illness  Pt is a 63 y/o F admitted on 11/16/23 after presenting for scheduled R first rib resection. PMH: ADD, lumbar & neck DDD, hypotension, hypothyroidism, Paget-Schroetter syndrome,  Clinical Impression  PT seen for PT evaluation with pt agreeable, spouse present for session. Pt reports prior to admission she was independent with all mobility, driving, working from home. PT educated pt on need to maintain RUE sling & assisted pt with donning sling. PT educated pt on how to dress upper body & limit ROM in RUE. Pt is able to complete bed mobility, transfers, & gait without AD independently. At this time, pt does not require acute PT services. PT to complete current orders at this time; please re-consult if new needs arise.        If plan is discharge home, recommend the following:     Can travel by private vehicle        Equipment Recommendations None recommended by PT  Recommendations for Other Services       Functional Status Assessment Patient has not had a recent decline in their functional status     Precautions / Restrictions Precautions Precautions: None Required Braces or Orthoses: Sling (RUE) Restrictions Weight Bearing Restrictions Per Provider Order: No (orders state to maintain R sling)      Mobility  Bed Mobility Overal bed mobility: Modified Independent                  Transfers Overall transfer level: Independent                      Ambulation/Gait Ambulation/Gait assistance: Independent Gait Distance (Feet): 200 Feet Assistive device: None Gait Pattern/deviations: Step-through pattern, WFL(Within Functional Limits) Gait velocity: WNL        Stairs            Wheelchair Mobility     Tilt Bed    Modified Rankin (Stroke Patients Only)       Balance Overall balance assessment:  Independent                                           Pertinent Vitals/Pain Pain Assessment Pain Assessment: Faces Faces Pain Scale: Hurts little more Pain Location: incision site Pain Descriptors / Indicators: Sore, Discomfort Pain Intervention(s): Monitored during session    Home Living Family/patient expects to be discharged to:: Private residence Living Arrangements: Spouse/significant other Available Help at Discharge: Family Type of Home: House Home Access: Stairs to enter   [employer] of Steps: 1 + threshold step   Home Layout: Two level;Able to live on main level with bedroom/bathroom        Prior Function Prior Level of Function : Independent/Modified Independent;Working/employed;Driving             Mobility Comments: denies falls, works from home, spends time with 12 grand kids       Extremity/Trunk Assessment   Upper Extremity Assessment Upper Extremity Assessment: Right hand dominant;RUE deficits/detail RUE Deficits / Details: RUE edema RUE: Unable to fully assess due to immobilization    Lower Extremity Assessment Lower Extremity Assessment: Overall WFL for tasks assessed    Cervical / Trunk Assessment Cervical / Trunk Assessment: Normal  Communication   Communication Communication: No apparent difficulties  Cognition Arousal: Alert Behavior During Therapy: WFL for tasks assessed/performed   PT - Cognitive impairments: No apparent impairments                         Following commands: Intact       Cueing Cueing Techniques: Verbal cues     General Comments General comments (skin integrity, edema, etc.): Assisted pt with donning R shoulder sling, discussed positioning with sling.    Exercises     Assessment/Plan    PT Assessment All further PT needs can be met in the next venue of care  PT Problem List Decreased range of motion       PT Treatment Interventions      PT Goals (Current  goals can be found in the Care Plan section)  Acute Rehab PT Goals Patient Stated Goal: recover post op PT Goal Formulation: With patient Time For Goal Achievement: 12/01/23 Potential to Achieve Goals: Good    Frequency       Co-evaluation               AM-PAC PT 6 Clicks Mobility  Outcome Measure Help needed turning from your back to your side while in a flat bed without using bedrails?: None Help needed moving from lying on your back to sitting on the side of a flat bed without using bedrails?: None Help needed moving to and from a bed to a chair (including a wheelchair)?: None Help needed standing up from a chair using your arms (e.g., wheelchair or bedside chair)?: None Help needed to walk in hospital room?: None Help needed climbing 3-5 steps with a railing? : None 6 Click Score: 24    End of Session Equipment Utilized During Treatment:  (RUE sling) Activity Tolerance: Patient tolerated treatment well Patient left: with call bell/phone within reach;in bed;with family/visitor present   PT Visit Diagnosis: Pain Pain - Right/Left: Right Pain - part of body: Shoulder    Time: 9166-9152 PT Time Calculation (min) (ACUTE ONLY): 14 min   Charges:   PT Evaluation $PT Eval Low Complexity: 1 Low   PT General Charges $$ ACUTE PT VISIT: 1 Visit         Richerd Pinal, PT, DPT 11/17/23, 9:00 AM   Richerd CHRISTELLA Pinal 11/17/2023, 8:59 AM

## 2023-11-17 NOTE — Discharge Summary (Signed)
 Discharge Summary  Patient ID: Carol Jacobs [id number] 62 y.o. 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  She was brought to the operating room by Dr. Colorado on 11/16/2023 underwent right first rib resection.  She tolerated procedure well and was admitted to the hospital postoperatively.  POD #1 she is feeling well for discharge.  She has full range of motion of the right arm.  Axilla incision is well-appearing without hematoma and she does not have any significant swelling of the right arm.  Breathing is also nonlabored on room air.  She will restart her Eliquis  this evening.  We will refer her to PT.  She will follow-up in the office to see Dr. Colorado in 2 to 3 weeks.  She will be prescribed 2 to 3 days of narcotic pain medication for continued postoperative pain control.  She will be discharged in stable condition.  Consults: None  Treatments: surgery: Right first rib resection by Dr. Colorado on 11/16/2023  Discharge Exam: See progress note 11/17/23 Vitals:   11/17/23 0640 11/17/23 0901  BP: (!) 101/47 (!) 99/54  Pulse:  64  Resp: 18 20  Temp:  98.4 F (36.9 C)  SpO2:  99%     Disposition: Discharge disposition: 01-Home or Self Care       Patient Instructions:  Allergies as of 11/17/2023       Reactions   Nitrofurantoin Shortness Of Breath, Nausea Only, Other (See Comments)   Dysphoria  Flushing  GI Upset   Ciprofloxacin    Dizziness, chills         Medication List     TAKE these medications     amphetamine-dextroamphetamine 10 MG tablet Commonly known as: ADDERALL Take 10 mg by mouth every evening.   apixaban  5 MG Tabs tablet Commonly known as: ELIQUIS  Take 1 tablet (5 mg total) by mouth 2 (two) times daily.   Armour Thyroid 120 MG tablet Generic drug: thyroid Take 120 mg by mouth every morning.   ascorbic acid 500 MG tablet Commonly known as: VITAMIN C Take 500 mg by mouth daily.   cyanocobalamin 1000 MCG tablet Commonly known as: VITAMIN B12 Take 1,000 mcg by mouth daily.   ELDERBERRY IMMUNE HEALTH GUMMY PO Take 1 each by mouth daily.   FISH OIL PO Take 1 capsule by mouth daily.   hydrocortisone 5 MG tablet Commonly known as: CORTEF Take 10 mg by mouth every morning.   liothyronine 5 MCG tablet Commonly known as: CYTOMEL Take 10 mcg by mouth 2 (two) times daily.   MAGNESIUM  PO Take 900 mg by mouth every evening.   ONE-A-DAY WOMENS PO Take 1 tablet by mouth daily.   oxyCODONE -acetaminophen  5-325 MG tablet Commonly known as: PERCOCET/ROXICET Take 1 tablet by mouth every 6 (six) hours as needed for moderate pain (pain score 4-6).   Prasterone (DHEA) 10 MG Caps Take 30 mg by mouth daily.   PROBIOTIC DAILY PO Take 1 capsule by mouth daily.   progesterone 200 MG capsule Commonly known as: PROMETRIUM Take 200 mg by mouth at bedtime.   tiZANidine 4 MG tablet Commonly  known as: ZANAFLEX Take 4 mg by mouth at bedtime as needed for muscle spasms.   traZODone 50 MG tablet Commonly known as: DESYREL Take 50 mg by mouth at bedtime.   tretinoin 0.05 % cream Commonly known as: RETIN-A Apply 1 application  topically at bedtime.   Vitamin D3 Liqd Take 4-5 drops by mouth daily. 6000-8000 units   Zinc 30 MG Tabs Take 30 mg by mouth daily.       Activity: activity as tolerated Diet: regular diet Wound Care: keep wound clean and dry  Follow-up with [facility] in 2 weeks.  Signed: Donnice Sender, PA-C 11/17/2023 10:20 AM [facility] Office:  [phone]

## 2023-11-29 ENCOUNTER — Encounter: Payer: Self-pay | Admitting: Vascular Surgery

## 2023-12-08 ENCOUNTER — Ambulatory Visit: Attending: Vascular Surgery | Admitting: Vascular Surgery

## 2023-12-08 ENCOUNTER — Encounter: Payer: Self-pay | Admitting: Vascular Surgery

## 2023-12-08 VITALS — BP 114/66 | HR 73 | Temp 99.0°F | Ht 65.0 in | Wt 143.0 lb

## 2023-12-08 DIAGNOSIS — I82890 Acute embolism and thrombosis of other specified veins: Secondary | ICD-10-CM

## 2023-12-08 NOTE — Progress Notes (Signed)
     Subjective:     Patient ID: Carol Jacobs, female   DOB: [birth year], 63 y.o.   MRN: [id number]  HPI 63 year old female status post right first rib resection and balloon angioplasty of the right subclavian vein.  She continues on Eliquis  which she began in mid May at the time of her diagnosis.  She has minimal pain except for certain positions.  Her hand is returned to normal strength.  She does complain of mild swelling in her right upper arm and had a hematoma in her axilla after surgery when she restarted Eliquis .   Review of Systems As above    Objective:   Physical Exam Vitals:   12/08/23 1514  BP: 114/66  Pulse: 73  Temp: 99 F (37.2 C)  SpO2: 94%   Awake alert oriented On the respirations Right hand grip strength is 5 out of 5 Right axillary incision healing well     Assessment:     63 year old female status post above-noted procedure now recovering well.    Plan:     Continue Eliquis  for total 3 months  Transition 81 mg aspirin  after Eliquis   Follow-up in 3 months with right upper extremity duplex     Othon Guardia C. Sheree, MD [facility] Office: [phone] Pager: [phone]

## 2023-12-13 ENCOUNTER — Other Ambulatory Visit: Payer: Self-pay

## 2023-12-13 DIAGNOSIS — I82890 Acute embolism and thrombosis of other specified veins: Secondary | ICD-10-CM

## 2024-03-15 ENCOUNTER — Ambulatory Visit (HOSPITAL_COMMUNITY)
Admission: RE | Admit: 2024-03-15 | Discharge: 2024-03-15 | Disposition: A | Discharge status: Home / Self Care | Source: Ambulatory Visit | Attending: Vascular Surgery | Admitting: Vascular Surgery

## 2024-03-15 ENCOUNTER — Encounter: Payer: Self-pay | Admitting: Vascular Surgery

## 2024-03-15 ENCOUNTER — Ambulatory Visit (INDEPENDENT_AMBULATORY_CARE_PROVIDER_SITE_OTHER): Admitting: Vascular Surgery

## 2024-03-15 VITALS — BP 104/69 | HR 75 | Temp 99.3°F | Ht 65.0 in | Wt 145.0 lb

## 2024-03-15 DIAGNOSIS — I82890 Acute embolism and thrombosis of other specified veins: Secondary | ICD-10-CM | POA: Diagnosis not present

## 2024-03-15 NOTE — Progress Notes (Signed)
 Patient ID: Carol Jacobs, female   DOB: 21-Mar-1961, 63 y.o.   MRN: [id number]  Reason for Consult: Follow-up   Referred by [place], PA-C  Subjective:     HPI:  Carol Jacobs is a 63 y.o. 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  She has no complaints related to today's visit other than possibly hyperhidrosis of the right axilla relative to the left she has no numbness or tingling and has full function of the right upper extremity.  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   PONV (postoperative nausea and vomiting)    History reviewed. No pertinent family history. Past Surgical History:  Procedure Laterality Date   ABDOMINAL HYSTERECTOMY     ARTHROSCOPIC REPAIR ACL Left    EYE SURGERY Right    retina tear   JOINT REPLACEMENT Right 2019   Hip replacement - [facility]   ORIF CALCANEOUS FRACTURE Right 05/11/2014   Procedure: OPEN REDUCTION INTERNAL FIXATION (ORIF) CALCANEOUS FRACTURE;  Surgeon: Jerona Harden GAILS, MD;  Location: [facility] OR;  Service: Orthopedics;  Laterality: Right;   PERIPHERAL VASCULAR THROMBECTOMY Right 11/08/2023   Procedure: PERIPHERAL VASCULAR THROMBECTOMY;  Surgeon: Sheree Penne Bruckner, MD;  Location: [facility] INVASIVE CV LAB;  Service: Cardiovascular;  Laterality: Right;   RIB RESECTION Right 11/16/2023   Procedure: EXCISION, RIB;  Surgeon: Sheree Penne Bruckner, MD;  Location: [facility] OR;  Service: Vascular;  Laterality:  Right;   shoulder sx     UPPER EXTREMITY ANGIOGRAM  11/16/2023   Procedure: VENOGRAM;  Surgeon: Sheree Penne Bruckner, MD;  Location: [facility] OR;  Service: Vascular;;   UPPER EXTREMITY VENOGRAPHY Right 11/08/2023   Procedure: UPPER EXTREMITY VENOGRAPHY;  Surgeon: Sheree Penne Bruckner, MD;  Location: [facility] INVASIVE CV LAB;  Service: Cardiovascular;  Laterality: Right;   WISDOM TOOTH EXTRACTION      Short Social History:  Social History   Tobacco Use   Smoking status: Never   Smokeless tobacco: Never  Substance Use Topics   Alcohol use: No    Allergies  Allergen Reactions   Nitrofurantoin Shortness Of Breath, Nausea Only and Other (See Comments)    Dysphoria  Flushing  GI Upset   Ciprofloxacin     Dizziness, chills     Current Outpatient Medications  Medication Sig Dispense Refill   amphetamine -dextroamphetamine  (ADDERALL) 10 MG tablet Take 10 mg by mouth every evening.     apixaban  (ELIQUIS ) 5 MG TABS tablet Take 1 tablet (5 mg total) by mouth 2 (two) times daily. 60 tablet 2   ARMOUR THYROID  120 MG tablet Take 120 mg by mouth every morning.     ascorbic acid  (VITAMIN C ) 500 MG tablet Take 500 mg by mouth daily.     Cholecalciferol (VITAMIN D3) LIQD Take 4-5 drops by mouth daily. 6000-8000 units     cyanocobalamin  (VITAMIN B12) 1000 MCG tablet Take 1,000 mcg by mouth daily.  Elderberry-Vitamin C -Zinc (ELDERBERRY IMMUNE HEALTH GUMMY PO) Take 1 each by mouth daily.     hydrocortisone  (CORTEF ) 5 MG tablet Take 10 mg by mouth every morning.     liothyronine  (CYTOMEL ) 5 MCG tablet Take 10 mcg by mouth 2 (two) times daily.     MAGNESIUM  PO Take 900 mg by mouth every evening.     Multiple Vitamins-Calcium (ONE-A-DAY WOMENS PO) Take 1 tablet by mouth daily.     Omega-3 Fatty Acids (FISH OIL PO) Take 1 capsule by mouth daily.     Prasterone , DHEA, 10 MG CAPS Take 30 mg by mouth daily.     Probiotic Product (PROBIOTIC DAILY PO) Take 1 capsule by mouth daily.     progesterone   (PROMETRIUM ) 200 MG capsule Take 200 mg by mouth at bedtime.     tiZANidine  (ZANAFLEX ) 4 MG tablet Take 4 mg by mouth at bedtime as needed for muscle spasms.     traZODone  (DESYREL ) 50 MG tablet Take 50 mg by mouth at bedtime.     tretinoin (RETIN-A) 0.05 % cream Apply 1 application  topically at bedtime.     Zinc 30 MG TABS Take 30 mg by mouth daily.     oxyCODONE -acetaminophen  (PERCOCET/ROXICET) 5-325 MG tablet Take 1 tablet by mouth every 6 (six) hours as needed for moderate pain (pain score 4-6). (Patient not taking: Reported on 03/15/2024) 20 tablet 0   No current facility-administered medications for this visit.    Review of Systems  Constitutional:  Constitutional negative. HENT: HENT negative.  Eyes: Eyes negative.  Respiratory: Respiratory negative.  Cardiovascular: Cardiovascular negative.  GI: Gastrointestinal negative.  Musculoskeletal: Musculoskeletal negative.  Skin: Skin negative.  Neurological: Neurological negative. Hematologic: Hematologic/lymphatic negative.  Psychiatric: Psychiatric negative.        Objective:  Objective  Vitals:   03/15/24 1141  BP: 104/69  Pulse: 75  Temp: 99.3 F (37.4 C)  SpO2: 98%     Physical Exam HENT:     Head: Normocephalic.     Nose: Nose normal.     Mouth/Throat:     Mouth: Mucous membranes are moist.  Cardiovascular:     Rate and Rhythm: Normal rate.     Pulses: Normal pulses.  Pulmonary:     Effort: Pulmonary effort is normal.  Abdominal:     General: Abdomen is flat.  Musculoskeletal:        General: Normal range of motion.     Cervical back: Normal range of motion and neck supple.     Comments: Well-healed right axillary incision  Skin:    General: Skin is warm.  Neurological:     General: No focal deficit present.     Mental Status: She is alert.     Data: Right Findings:  +----------+------------+---------+-----------+----------+-------+  RIGHT     CompressiblePhasicitySpontaneousPropertiesSummary  +----------+------------+---------+-----------+----------+-------+  IJV          Full                                           +----------+------------+---------+-----------+----------+-------+  Subclavian   Full                                           +----------+------------+---------+-----------+----------+-------+  Axillary     Full                                           +----------+------------+---------+-----------+----------+-------+  Brachial   Partial                                 Chronic  +----------+------------+---------+-----------+----------+-------+  Radial       Full                                           +----------+------------+---------+-----------+----------+-------+  Ulnar        Full                                           +----------+------------+---------+-----------+----------+-------+  Cephalic     Full                                           +----------+------------+---------+-----------+----------+-------+  Basilic      Full                                           +----------+------------+---------+-----------+----------+-------+     Summary:    Right:  No evidence of deep vein thrombosis in the upper extremity.  There is a hypoechoic area in the proximal internal jugular vein which  appears  to be artifact but cannot rule out chronic thrombus. This area is  compressible.      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  As such she can see me on an as-needed basis.     Penne Lonni Colorado MD [facility]
